# Patient Record
Sex: Female | Born: 1985 | Race: White | Hispanic: No | Marital: Married | Smoking: Never smoker
Health system: Southern US, Community
[De-identification: ages and names within clinical notes are randomized; demographics above are authoritative.]

## PROBLEM LIST (undated history)

## (undated) DIAGNOSIS — R51 Headache: Secondary | ICD-10-CM

## (undated) DIAGNOSIS — R519 Headache, unspecified: Secondary | ICD-10-CM

## (undated) DIAGNOSIS — T7840XA Allergy, unspecified, initial encounter: Secondary | ICD-10-CM

## (undated) DIAGNOSIS — K219 Gastro-esophageal reflux disease without esophagitis: Secondary | ICD-10-CM

## (undated) DIAGNOSIS — M25559 Pain in unspecified hip: Secondary | ICD-10-CM

## (undated) DIAGNOSIS — G43909 Migraine, unspecified, not intractable, without status migrainosus: Secondary | ICD-10-CM

## (undated) DIAGNOSIS — J302 Other seasonal allergic rhinitis: Secondary | ICD-10-CM

## (undated) DIAGNOSIS — F319 Bipolar disorder, unspecified: Secondary | ICD-10-CM

## (undated) DIAGNOSIS — M25569 Pain in unspecified knee: Secondary | ICD-10-CM

## (undated) DIAGNOSIS — N946 Dysmenorrhea, unspecified: Secondary | ICD-10-CM

## (undated) HISTORY — DX: Dysmenorrhea, unspecified: N94.6

## (undated) HISTORY — DX: Allergy, unspecified, initial encounter: T78.40XA

## (undated) HISTORY — DX: Gastro-esophageal reflux disease without esophagitis: K21.9

## (undated) HISTORY — DX: Headache, unspecified: R51.9

## (undated) HISTORY — DX: Bipolar disorder, unspecified: F31.9

## (undated) HISTORY — PX: TONSILLECTOMY: SUR1361

## (undated) HISTORY — DX: Other seasonal allergic rhinitis: J30.2

## (undated) HISTORY — DX: Pain in unspecified knee: M25.569

## (undated) HISTORY — PX: PLACEMENT OF BREAST IMPLANTS: SHX6334

## (undated) HISTORY — DX: Pain in unspecified hip: M25.559

## (undated) HISTORY — DX: Migraine, unspecified, not intractable, without status migrainosus: G43.909

## (undated) HISTORY — DX: Headache: R51

## (undated) HISTORY — PX: BREAST RECONSTRUCTION: SHX9

---
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
## 2010-11-19 DIAGNOSIS — F329 Major depressive disorder, single episode, unspecified: Secondary | ICD-10-CM | POA: Insufficient documentation

## 2010-11-19 DIAGNOSIS — G43909 Migraine, unspecified, not intractable, without status migrainosus: Secondary | ICD-10-CM | POA: Insufficient documentation

## 2010-11-19 DIAGNOSIS — F3181 Bipolar II disorder: Secondary | ICD-10-CM | POA: Insufficient documentation

## 2010-11-19 DIAGNOSIS — J302 Other seasonal allergic rhinitis: Secondary | ICD-10-CM | POA: Insufficient documentation

## 2010-11-19 DIAGNOSIS — F419 Anxiety disorder, unspecified: Secondary | ICD-10-CM | POA: Insufficient documentation

## 2013-12-25 DIAGNOSIS — N946 Dysmenorrhea, unspecified: Secondary | ICD-10-CM | POA: Insufficient documentation

## 2014-01-02 LAB — HM PAP SMEAR: HM Pap smear: NEGATIVE

## 2015-07-26 ENCOUNTER — Ambulatory Visit: Payer: BLUE CROSS/BLUE SHIELD | Admitting: Nurse Practitioner

## 2015-08-09 ENCOUNTER — Ambulatory Visit: Payer: BLUE CROSS/BLUE SHIELD | Admitting: Nurse Practitioner

## 2015-08-09 ENCOUNTER — Ambulatory Visit: Payer: BLUE CROSS/BLUE SHIELD | Admitting: Family Medicine

## 2015-08-22 ENCOUNTER — Encounter: Payer: Self-pay | Admitting: Obstetrics and Gynecology

## 2015-08-22 ENCOUNTER — Ambulatory Visit (INDEPENDENT_AMBULATORY_CARE_PROVIDER_SITE_OTHER): Payer: BLUE CROSS/BLUE SHIELD | Admitting: Obstetrics and Gynecology

## 2015-08-22 VITALS — BP 114/85 | HR 74 | Ht 69.0 in | Wt 218.8 lb

## 2015-08-22 DIAGNOSIS — B379 Candidiasis, unspecified: Secondary | ICD-10-CM | POA: Diagnosis not present

## 2015-08-22 DIAGNOSIS — F526 Dyspareunia not due to a substance or known physiological condition: Secondary | ICD-10-CM | POA: Diagnosis not present

## 2015-08-22 DIAGNOSIS — L439 Lichen planus, unspecified: Secondary | ICD-10-CM | POA: Diagnosis not present

## 2015-08-22 MED ORDER — DESOGESTREL-ETHINYL ESTRADIOL 0.15-0.02/0.01 MG (21/5) PO TABS: 1.0000 | ORAL_TABLET | Freq: Every day | ORAL | Status: DC

## 2015-08-22 MED ORDER — FLUCONAZOLE 150 MG PO TABS: 150.0000 mg | ORAL_TABLET | Freq: Once | ORAL | Status: DC

## 2015-08-22 MED ORDER — CLOBETASOL PROPIONATE 0.05 % EX OINT: 1.0000 "application " | TOPICAL_OINTMENT | Freq: Two times a day (BID) | CUTANEOUS | Status: DC

## 2015-08-22 MED ORDER — OXYCODONE-ACETAMINOPHEN 10-325 MG PO TABS: 1.0000 | ORAL_TABLET | ORAL | Status: DC | PRN

## 2015-08-22 NOTE — Patient Instructions (Signed)
Thank you for enrolling in MyChart. Please follow the instructions below to securely access your online medical record. MyChart allows you to send messages to your doctor, view your test results, renew your prescriptions, schedule appointments, and more.  How Do I Sign Up? 1. In your Internet browser, go to [email or web address] 2. Click on the New  User? link in the Sign In box.  3. Enter your MyChart Access Code exactly as it appears below. You will not need to use this code after you have completed the sign-up process. If you do not sign up before the expiration date, you must request a new code. MyChart Access Code: [email or web address] Expires: 09/03/2015 11:52 AM  4. Enter the last four digits of your Social Security Number (xxxx) and Date of Birth (mm/dd/yyyy) as indicated and click Next. You will be taken to the next sign-up page. 5. Create a MyChart ID. This will be your MyChart login ID and cannot be changed, so think of one that is secure and easy to remember. 6. Create a MyChart password. You can change your password at any time. 7. Enter your Password Reset Question and Answer and click Next. This can be used at a later time if you forget your password.  8. Select your communication preference, and if applicable enter your e-mail address. You will receive e-mail notification when new information is available in MyChart by choosing to receive e-mail notifications and filling in your e-mail. 9. Click Sign In. You can now view your medical record.   Additional Information If you have questions, you can email [email or web address] or call [phone] to talk to our MyChart staff. Remember, MyChart is NOT to be used for urgent needs. For medical emergencies, dial 911.   Lichen Sclerosus Lichen sclerosus is a skin problem. It can happen on any part of the body, but it commonly involves the anal or genital areas. It can cause itching and discomfort in these areas.  Treatment can help to control symptoms. When the genital area is affected, getting treatment is important because the condition can cause scarring that may lead to other problems. CAUSES The cause of this condition is not known. It could be the result of an overactive immune system or a lack of certain hormones. Lichen sclerosus is not an infection or a fungus. It is not passed from one person to another (not contagious). RISK FACTORS This condition is more likely to develop in women, usually after menopause. SYMPTOMS Symptoms of this condition include:  Thin, wrinkled, white areas on the skin.  Thickened white areas on the skin.  Red and swollen patches (lesions) on the skin.  Tears or cracks in the skin.  Bruising.  Blood blisters.  Severe itching. You may also have pain, itching, or burning with urination. Constipation is also common in people with lichen sclerosus. DIAGNOSIS This condition may be diagnosed with a physical exam. In some cases, a tissue sample (biopsy sample) may be removed to be looked at under a microscope. TREATMENT This condition is usually treated with medicated creams or ointments (topical steroids) that are applied over the affected areas. HOME CARE INSTRUCTIONS  Take over-the-counter and prescription medicines only as told by your health care provider.  Use creams or ointments as told by your health care provider.  Do not scratch the affected areas of skin.  Women should keep the vaginal area as clean and dry as possible.  Keep all follow-up visits as told by your health care  provider. This is important. SEEK MEDICAL CARE IF:  You have increasing redness, swelling, or pain in the affected area.  You have fluid, blood, or pus coming from the affected area.  You have new lesions on your skin.  You have pain or burning with urination.  You have pain during sex.   This information is not intended to replace advice given to you by your health  care provider. Make sure you discuss any questions you have with your health care provider.   Document Released: 09/10/2010 Document Revised: 01/09/2015 Document Reviewed: 07/16/2014 Elsevier Interactive Patient Education [employer]2016 Elsevier Inc.

## 2015-08-22 NOTE — Progress Notes (Signed)
Subjective:     Patient ID: Rachael Bennett, female   DOB: 1985-07-07, 30 y.o.   MRN: [id number]  HPI Progressively worsening pain at introitus with penetration, burning and tearing at times, vaginal itching all over. Menses are monthly but severe cramping and  Heavy on current OCP. Needs refill on percocet. [place] [facility] [place] [place] Sexually active a few times a month with fiance and they have been together for 3 years.   Review of Systems See above    Objective:   Physical Exam A&O x4  well groomed obese female in no distress Blood pressure 114/85, pulse 74, height 5\' 9"  (1.753 m), weight 218 lb 12.8 oz (99.247 kg), last menstrual period 08/10/2015. Pelvic exam: VULVA: normal appearing vulva with no masses, tenderness or lesions, vulvar hypopigmentation c/w lichen at 12 and 6 o clock, minimal labia minora tissue noted, with scaring at 6 o clock extending to rectum, tender on exam VAGINA: normal appearing vagina with normal color and discharge, no lesions, vaginal discharge - white, copious and creamy, RECTAL: normal rectal, no masses, no visible warts, WET MOUNT done - results: negative for pathogens, normal epithelial cells, lactobacilli.    Assessment:     Vaginal irritation dysparena Yeast infection Lichens planus   dysmenorrhea    Plan:     Diflucan 150mg  now, may repeat as needed. rx for clobetasol oint. To apply nightly to affected area x 6-8 weeks. Changed OCPs to Mircette- to start now.  RTC 6 weeks to recheck meds and perineum  Rachael Bennett, CNM

## 2015-09-03 ENCOUNTER — Ambulatory Visit (INDEPENDENT_AMBULATORY_CARE_PROVIDER_SITE_OTHER): Payer: BLUE CROSS/BLUE SHIELD | Admitting: Family Medicine

## 2015-09-03 ENCOUNTER — Encounter: Payer: Self-pay | Admitting: Family Medicine

## 2015-09-03 VITALS — BP 122/85 | HR 77 | Temp 98.9°F | Resp 16 | Ht 69.0 in | Wt 212.6 lb

## 2015-09-03 DIAGNOSIS — K591 Functional diarrhea: Secondary | ICD-10-CM | POA: Insufficient documentation

## 2015-09-03 DIAGNOSIS — E739 Lactose intolerance, unspecified: Secondary | ICD-10-CM | POA: Insufficient documentation

## 2015-09-03 DIAGNOSIS — M542 Cervicalgia: Secondary | ICD-10-CM

## 2015-09-03 DIAGNOSIS — Z7189 Other specified counseling: Secondary | ICD-10-CM | POA: Diagnosis not present

## 2015-09-03 DIAGNOSIS — J302 Other seasonal allergic rhinitis: Secondary | ICD-10-CM

## 2015-09-03 DIAGNOSIS — Z8249 Family history of ischemic heart disease and other diseases of the circulatory system: Secondary | ICD-10-CM | POA: Diagnosis not present

## 2015-09-03 DIAGNOSIS — Z833 Family history of diabetes mellitus: Secondary | ICD-10-CM

## 2015-09-03 DIAGNOSIS — G43709 Chronic migraine without aura, not intractable, without status migrainosus: Secondary | ICD-10-CM | POA: Diagnosis not present

## 2015-09-03 DIAGNOSIS — F3181 Bipolar II disorder: Secondary | ICD-10-CM

## 2015-09-03 DIAGNOSIS — Z7689 Persons encountering health services in other specified circumstances: Secondary | ICD-10-CM

## 2015-09-03 DIAGNOSIS — K219 Gastro-esophageal reflux disease without esophagitis: Secondary | ICD-10-CM | POA: Diagnosis not present

## 2015-09-03 DIAGNOSIS — R1084 Generalized abdominal pain: Secondary | ICD-10-CM | POA: Diagnosis not present

## 2015-09-03 MED ORDER — CULTURELLE DIGESTIVE HEALTH PO CAPS: 1.0000 | ORAL_CAPSULE | Freq: Every day | ORAL | Status: DC

## 2015-09-03 MED ORDER — FLUTICASONE PROPIONATE 50 MCG/ACT NA SUSP: 2.0000 | Freq: Every day | NASAL | Status: AC

## 2015-09-03 MED ORDER — CYCLOBENZAPRINE HCL 10 MG PO TABS: 10.0000 mg | ORAL_TABLET | Freq: Three times a day (TID) | ORAL | Status: DC | PRN

## 2015-09-03 MED ORDER — PROPRANOLOL HCL 40 MG PO TABS: 40.0000 mg | ORAL_TABLET | Freq: Two times a day (BID) | ORAL | Status: DC

## 2015-09-03 MED ORDER — LORATADINE 10 MG PO TABS: 10.0000 mg | ORAL_TABLET | Freq: Every day | ORAL | Status: DC

## 2015-09-03 MED ORDER — OMEPRAZOLE 20 MG PO CPDR: 20.0000 mg | DELAYED_RELEASE_CAPSULE | Freq: Every day | ORAL | Status: DC

## 2015-09-03 NOTE — Assessment & Plan Note (Signed)
Stomach pains may be dairy related- may also be IBS vs celiac disease. Continue to avoid dairy if it triggers symptoms.

## 2015-09-03 NOTE — Assessment & Plan Note (Signed)
IBS vs. Other GI disease. Check celiac panel. Consider allergy testing. Consider GI referral if symptoms do not improve.

## 2015-09-03 NOTE — Assessment & Plan Note (Signed)
Omeprazole 20mg  once daily. Alarm symptoms reviewed. Avoid diet triggers. Condier EGD if symptoms worsen.
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
## 2015-09-03 NOTE — Assessment & Plan Note (Signed)
Renewed flonase and claritin for seasonal allergies.
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
## 2015-09-03 NOTE — Assessment & Plan Note (Signed)
Continue inderal for migraine prophylaxis. Alarm symptoms reviewed.

## 2015-09-03 NOTE — Assessment & Plan Note (Signed)
Managed by psychiatry 

## 2015-09-03 NOTE — Patient Instructions (Signed)
Stomach pain: Try a probiotic- take a florastar or culturelle once daily. Continue immodium as needed. We will get some lab work to check on your stomach issues.  Look into the FODMAP diet.   Continue your omeprazole for GERD.

## 2015-09-05 LAB — CBC WITH DIFFERENTIAL/PLATELET
BASOS ABS: 0.1 10*3/uL (ref 0.0–0.2)
Basos: 0 %
EOS (ABSOLUTE): 0.1 10*3/uL (ref 0.0–0.4)
Eos: 1 %
HEMOGLOBIN: 11.5 g/dL (ref 11.1–15.9)
Hematocrit: 36.1 % (ref 34.0–46.6)
IMMATURE GRANS (ABS): 0 10*3/uL (ref 0.0–0.1)
IMMATURE GRANULOCYTES: 0 %
LYMPHS: 25 %
Lymphocytes Absolute: 2.9 10*3/uL (ref 0.7–3.1)
MCH: 26.1 pg — ABNORMAL LOW (ref 26.6–33.0)
MCHC: 31.9 g/dL (ref 31.5–35.7)
MCV: 82 fL (ref 79–97)
MONOCYTES: 8 %
Monocytes Absolute: 0.9 10*3/uL (ref 0.1–0.9)
NEUTROS PCT: 66 %
Neutrophils Absolute: 7.7 10*3/uL — ABNORMAL HIGH (ref 1.4–7.0)
PLATELETS: 509 10*3/uL — AB (ref 150–379)
RBC: 4.41 x10E6/uL (ref 3.77–5.28)
RDW: 15.5 % — ABNORMAL HIGH (ref 12.3–15.4)
WBC: 11.6 10*3/uL — AB (ref 3.4–10.8)

## 2015-09-05 LAB — COMPREHENSIVE METABOLIC PANEL
A/G RATIO: 1.5 (ref 1.2–2.2)
ALBUMIN: 4.3 g/dL (ref 3.5–5.5)
ALK PHOS: 82 IU/L (ref 39–117)
ALT: 13 IU/L (ref 0–32)
AST: 13 IU/L (ref 0–40)
BUN / CREAT RATIO: 10 (ref 9–23)
BUN: 9 mg/dL (ref 6–20)
CHLORIDE: 102 mmol/L (ref 96–106)
CO2: 22 mmol/L (ref 18–29)
Calcium: 9.4 mg/dL (ref 8.7–10.2)
Creatinine, Ser: 0.93 mg/dL (ref 0.57–1.00)
GFR calc non Af Amer: 82 mL/min/{1.73_m2} (ref >59)
GFR, EST AFRICAN AMERICAN: 95 mL/min/{1.73_m2} (ref >59)
GLOBULIN, TOTAL: 2.8 g/dL (ref 1.5–4.5)
GLUCOSE: 90 mg/dL (ref 65–99)
Potassium: 4.5 mmol/L (ref 3.5–5.2)
Sodium: 141 mmol/L (ref 134–144)
TOTAL PROTEIN: 7.1 g/dL (ref 6.0–8.5)

## 2015-09-05 LAB — CELIAC AB TTG DGP TIGA
Antigliadin Abs, IgA: 3 units (ref 0–19)
GLIADIN IGG: 2 U (ref 0–19)
IGA/IMMUNOGLOBULIN A, SERUM: 140 mg/dL (ref 87–352)
Transglutaminase IgA: <2 U/mL (ref 0–3)

## 2015-09-05 LAB — LIPID PANEL
CHOLESTEROL TOTAL: 200 mg/dL — AB (ref 100–199)
Chol/HDL Ratio: 3.7 ratio units (ref 0.0–4.4)
HDL: 54 mg/dL (ref >39)
LDL Calculated: 118 mg/dL — ABNORMAL HIGH (ref 0–99)
Triglycerides: 140 mg/dL (ref 0–149)
VLDL CHOLESTEROL CAL: 28 mg/dL (ref 5–40)

## 2015-09-05 LAB — HEMOGLOBIN A1C
Est. average glucose Bld gHb Est-mCnc: 123 mg/dL
Hgb A1c MFr Bld: 5.9 % — ABNORMAL HIGH (ref 4.8–5.6)

## 2015-10-09 ENCOUNTER — Ambulatory Visit (INDEPENDENT_AMBULATORY_CARE_PROVIDER_SITE_OTHER): Payer: BLUE CROSS/BLUE SHIELD | Admitting: Obstetrics and Gynecology

## 2015-10-09 ENCOUNTER — Encounter: Payer: Self-pay | Admitting: Obstetrics and Gynecology

## 2015-10-09 VITALS — BP 107/75 | HR 62 | Wt 212.4 lb

## 2015-10-09 DIAGNOSIS — B379 Candidiasis, unspecified: Secondary | ICD-10-CM | POA: Diagnosis not present

## 2015-10-09 DIAGNOSIS — N941 Unspecified dyspareunia: Secondary | ICD-10-CM

## 2015-10-09 DIAGNOSIS — R102 Pelvic and perineal pain: Secondary | ICD-10-CM | POA: Diagnosis not present

## 2015-10-09 DIAGNOSIS — L280 Lichen simplex chronicus: Secondary | ICD-10-CM | POA: Diagnosis not present

## 2015-10-09 NOTE — Progress Notes (Signed)
Subjective:     Patient ID: Rachael MunroeKatherine C Foltz, female   DOB: 09/23/1985, 30 y.o.   MRN: [id number]  HPI Here for follow-up exam for lichen of vulvar and dysmenorrhea/dysparenia. States all external 'tears' and pain are gone. States last menses, which was first one on Mircette, was less heavy but lasted a day or so longer, and cramping was there but not as bad. She reports less pain with sex and deep penetration- but still there. Also reports intermittent sharp pains over last week in lower pelvic are and in finishing an antibiotic for sinusitis.   Review of Systems See above    Objective:   Physical Exam A&O x4  well groomed female slightly anxious Filed Vitals:   10/09/15 1527  Weight: 212 lb 6 oz (96.333 kg)  abdomen soft and non-tender Pelvic exam: normal external genitalia, vulva, vagina, cervix, uterus and adnexa, WET MOUNT done - results: negative for pathogens, normal epithelial cells, lactobacilli.    Assessment:     [facility] Dysmenorrhea dysparenia improved, but not resolved  Pelvic pain of unknown etiology Yeast infection    Plan:     Told to hold onto clobex in case of flare ups of lichens Continue current OCP, will consider continuous dose if menses don't improve by end of third pack, or switching to depo  To take previously prescribed diflucan Counseled on possibility of endometriosis and treatment options.  RTC as needed.  Melody FontenelleShambley, CNM

## 2015-10-09 NOTE — Patient Instructions (Signed)
Endometriosis Endometriosis is a condition in which the tissue that lines the uterus (endometrium) grows outside of its normal location. The tissue may grow in many locations close to the uterus, but it commonly grows on the ovaries, fallopian tubes, vagina, or bowel. Because the uterus expels, or sheds, its lining every menstrual cycle, there is bleeding wherever the endometrial tissue is located. This can cause pain because blood is irritating to tissues not normally exposed to it.  CAUSES  The cause of endometriosis is not known.  SIGNS AND SYMPTOMS  Often, there are no symptoms. When symptoms are present, they can vary with the location of the displaced tissue. Various symptoms can occur at different times. Although symptoms occur mainly during a woman's menstrual period, they can also occur midcycle and usually stop with menopause. Some people may go months with no symptoms at all. Symptoms may include:   Back or abdominal pain.   Heavier bleeding during periods.   Pain during intercourse.   Painful bowel movements.   Infertility. DIAGNOSIS  Your health care provider will do a physical exam and ask about your symptoms. Various tests may be done, such as:   Blood tests and urine tests. These are done to help rule out other problems.   Ultrasound. This test is done to look for abnormal tissue.   An X-ray of the lower bowel (barium enema).  Laparoscopy. In this procedure, a thin, lighted tube with a tiny camera on the end (laparoscope) is inserted into your abdomen. This helps your health care provider look for abnormal tissue to confirm the diagnosis. The health care provider may also remove a small piece of tissue (biopsy) from any abnormal tissue found. This tissue sample can then be sent to a lab so it can be looked at under a microscope. TREATMENT  Treatment will vary and may include:   Medicines to relieve pain. Nonsteroidal anti-inflammatory drugs (NSAIDs) are a type of  pain medicine that can help to relieve the pain caused by endometriosis.  Hormonal therapy. When using hormonal therapy, periods are eliminated. This eliminates the monthly exposure to blood by the displaced endometrial tissue.   Surgery. Surgery may sometimes be done to remove the abnormal endometrial tissue. In severe cases, surgery may be done to remove the fallopian tubes, uterus, and ovaries (hysterectomy). HOME CARE INSTRUCTIONS   Take all medicines as directed by your health care provider. Do not take aspirin because it may increase bleeding when you are not on hormonal therapy.   Avoid activities that produce pain, including sexual activity. SEEK MEDICAL CARE IF:  You have pelvic pain before, after, or during your periods.  You have pelvic pain between periods that gets worse during your period.  You have pelvic pain during or after sex.  You have pelvic pain with bowel movements or urination, especially during your period.  You have problems getting pregnant.  You have a fever. SEEK IMMEDIATE MEDICAL CARE IF:   Your pain is severe and is not responding to pain medicine.   You have severe nausea and vomiting, or you cannot keep foods down.   You have pain that is limited to the right lower part of your abdomen.   You have swelling or increasing pain in your abdomen.   You see blood in your stool.  MAKE SURE YOU:   Understand these instructions.  Will watch your condition.  Will get help right away if you are not doing well or get worse.   This information   is not intended to replace advice given to you by your health care provider. Make sure you discuss any questions you have with your health care provider.   Document Released: 04/17/2000 Document Revised: 05/11/2014 Document Reviewed: 12/16/2012 Elsevier Interactive Patient Education 2016 [employer]  

## 2015-10-10 ENCOUNTER — Telehealth: Payer: Self-pay | Admitting: Obstetrics and Gynecology

## 2015-10-10 NOTE — Telephone Encounter (Signed)
Patient called stating she found out at her last visit she may have endometriosis. She just wanted to know what the next step would be as far as treatment. Please Advise

## 2015-10-21 ENCOUNTER — Encounter: Payer: Self-pay | Admitting: Family Medicine

## 2015-10-21 ENCOUNTER — Ambulatory Visit (INDEPENDENT_AMBULATORY_CARE_PROVIDER_SITE_OTHER): Payer: BLUE CROSS/BLUE SHIELD | Admitting: Family Medicine

## 2015-10-21 VITALS — BP 110/72 | HR 90 | Temp 98.8°F | Resp 16 | Ht 69.0 in | Wt 211.0 lb

## 2015-10-21 DIAGNOSIS — W57XXXA Bitten or stung by nonvenomous insect and other nonvenomous arthropods, initial encounter: Secondary | ICD-10-CM

## 2015-10-21 DIAGNOSIS — T148 Other injury of unspecified body region: Secondary | ICD-10-CM | POA: Diagnosis not present

## 2015-10-21 MED ORDER — TRIAMCINOLONE ACETONIDE 0.1 % EX CREA: 1.0000 "application " | TOPICAL_CREAM | Freq: Two times a day (BID) | CUTANEOUS | Status: DC

## 2015-10-21 NOTE — Progress Notes (Signed)
Subjective:    Patient ID: Rachael Bennett, female    DOB: [birth year], 30 y.o.   MRN: [id number]  HPI: Rachael Bennett is a 30 y.o. female presenting on 10/21/2015 for Insect Bite   HPI  Pt presents for tick bite. Was hiking- found tick on her L lower leg 2 weeks ago (approx 6/3). Removed with tissue and flushed him down toiler. Tick was small and brown. Present  Minutes to <1 hour. Had a small red area- pin prick sized that was healing well. Now mildy raised and itchy litke a mosquito bite after sitting on her porch last night. No fevers, no joint pain, no rashes. No HA. Was itching yesterday- a clear liquid and a little blood came out.   Past Medical History  Diagnosis Date  . Dysmenorrhea   . Bipolar disorder (HCC)   . Seasonal allergies   . Acid reflux   . Allergy   . Headache   . Migraine   . Hip pain   . Knee pain     Current Outpatient Prescriptions on File Prior to Visit  Medication Sig  . clobetasol ointment (TEMOVATE) 0.05 % Apply 1 application topically 2 (two) times daily. Apply to affected area  . cyclobenzaprine (FLEXERIL) 10 MG tablet Take 1 tablet (10 mg total) by mouth 3 (three) times daily as needed.  . desogestrel-ethinyl estradiol (MIRCETTE) 0.15-0.02/0.01 MG (21/5) tablet Take 1 tablet by mouth daily.  . fluticasone (FLONASE) 50 MCG/ACT nasal spray Place 2 sprays into both nostrils daily.  . Lactobacillus-Inulin (CULTURELLE DIGESTIVE HEALTH) CAPS Take 1 capsule by mouth daily.  Marland Kitchen loratadine (CLARITIN) 10 MG tablet Take 1 tablet (10 mg total) by mouth daily.  Marland Kitchen LORazepam (ATIVAN) 0.5 MG tablet Take 1 tablet by mouth as needed.   Marland Kitchen omeprazole (PRILOSEC) 20 MG capsule Take 1 capsule by mouth daily.  Marland Kitchen omeprazole (PRILOSEC) 20 MG capsule Take 1 capsule (20 mg total) by mouth daily.  . Oxcarbazepine (TRILEPTAL) 300 MG tablet Take 1 tablet by mouth 2 (two) times a week.  Marland Kitchen oxyCODONE-acetaminophen (PERCOCET) 10-325 MG tablet Take 1 tablet by mouth as needed.  .  propranolol (INDERAL) 40 MG tablet Take 1 tablet (40 mg total) by mouth 2 (two) times daily.  . Vitamins/Minerals TABS Take 1 tablet by mouth daily.  Marland Kitchen albuterol (PROVENTIL HFA;VENTOLIN HFA) 108 (90 Base) MCG/ACT inhaler Inhale into the lungs.   No current facility-administered medications on file prior to visit.    Review of Systems  Constitutional: Negative for fever, chills, activity change, appetite change and fatigue.  Musculoskeletal: Negative for arthralgias.  Skin: Positive for rash.       Itching   Hematological: Negative for adenopathy.   Per HPI unless specifically indicated above     Objective:    BP 110/72 mmHg  Pulse 90  Temp(Src) 98.8 F (37.1 C) (Oral)  Resp 16  Ht  (1.753 m)  Wt 211 lb (95.709 kg)  BMI 31.15 kg/m2  LMP 09/16/2015 (Approximate)  Wt Readings from Last 3 Encounters:  10/21/15 211 lb (95.709 kg)  10/09/15 212 lb 6 oz (96.333 kg)  09/03/15 212 lb 9.6 oz (96.435 kg)    Physical Exam  Cardiovascular: Normal rate and regular rhythm.   Lymphadenopathy:       Right: No inguinal adenopathy present.       Left: No inguinal adenopathy present.  Skin: Skin is warm and dry.  Single erythematous papule with ulcerated center. No erythema migrains. No  lymphadenopathy.   Results for orders placed or performed in visit on 09/03/15  HM PAP SMEAR  Result Value Ref Range   HM Pap smear negative   CBC with Differential  Result Value Ref Range   WBC 11.6 (H) 3.4 - 10.8 x10E3/uL   RBC 4.41 3.77 - 5.28 x10E6/uL   Hemoglobin 11.5 11.1 - 15.9 g/dL   Hematocrit 78.236.1 95.634.0 - 46.6 %   MCV 82 79 - 97 fL   MCH 26.1 (L) 26.6 - 33.0 pg   MCHC 31.9 31.5 - 35.7 g/dL   RDW 21.315.5 (H) 08.612.3 - 57.815.4 %   Platelets 509 (H) 150 - 379 x10E3/uL   Neutrophils 66 %   Lymphs 25 %   Monocytes 8 %   Eos 1 %   Basos 0 %   Neutrophils Absolute 7.7 (H) 1.4 - 7.0 x10E3/uL   Lymphocytes Absolute 2.9 0.7 - 3.1 x10E3/uL   Monocytes Absolute 0.9 0.1 - 0.9 x10E3/uL   EOS  (ABSOLUTE) 0.1 0.0 - 0.4 x10E3/uL   Basophils Absolute 0.1 0.0 - 0.2 x10E3/uL   Immature Granulocytes 0 %   Immature Grans (Abs) 0.0 0.0 - 0.1 x10E3/uL  Comprehensive Metabolic Panel (CMET)  Result Value Ref Range   Glucose 90 65 - 99 mg/dL   BUN 9 6 - 20 mg/dL   Creatinine, Ser 4.690.93 0.57 - 1.00 mg/dL   GFR calc non Af Amer 82 >59 mL/min/1.73   GFR calc Af Amer 95 >59 mL/min/1.73   BUN/Creatinine Ratio 10 9 - 23   Sodium 141 134 - 144 mmol/L   Potassium 4.5 3.5 - 5.2 mmol/L   Chloride 102 96 - 106 mmol/L   CO2 22 18 - 29 mmol/L   Calcium 9.4 8.7 - 10.2 mg/dL   Total Protein 7.1 6.0 - 8.5 g/dL   Albumin 4.3 3.5 - 5.5 g/dL   Globulin, Total 2.8 1.5 - 4.5 g/dL   Albumin/Globulin Ratio 1.5 1.2 - 2.2   Bilirubin Total <0.2 0.0 - 1.2 mg/dL   Alkaline Phosphatase 82 39 - 117 IU/L   AST 13 0 - 40 IU/L   ALT 13 0 - 32 IU/L  Lipid Profile  Result Value Ref Range   Cholesterol, Total 200 (H) 100 - 199 mg/dL   Triglycerides 629140 0 - 149 mg/dL   HDL 54 >52>39 mg/dL   VLDL Cholesterol Cal 28 5 - 40 mg/dL   LDL Calculated 841118 (H) 0 - 99 mg/dL   Chol/HDL Ratio 3.7 0.0 - 4.4 ratio units  Celiac Ab tTG DGP TIgA  Result Value Ref Range   IgA/Immunoglobulin A, Serum 140 87 - 352 mg/dL   Antigliadin Abs, IgA 3 0 - 19 units   Gliadin IgG 2 0 - 19 units   Transglutaminase IgA <2 0 - 3 U/mL   Tissue Transglut Ab <2 0 - 5 U/mL  Hemoglobin A1c  Result Value Ref Range   Hgb A1c MFr Bld 5.9 (H) 4.8 - 5.6 %   Est. average glucose Bld gHb Est-mCnc 123 mg/dL      Assessment & Plan:   Problem List Items Addressed This Visit    None    Visit Diagnoses    Insect bite    -  Primary    Likely an insect bit over previous tick bite. Low suspicion for tick bourne illnesses. Does not meet criteria for prophylaxis. Steroid cream BID. Alarm symptoms reviewed. Return if symptoms occur.     Relevant Medications  triamcinolone cream (KENALOG) 0.1 %       Meds ordered this encounter  Medications  .  triamcinolone cream (KENALOG) 0.1 %    Sig: Apply 1 application topically 2 (two) times daily.    Dispense:  30 g    Refill:  0    Order Specific Question:  Supervising Provider    Answer:  Janeann Forehand [[id number]]      Follow up plan: Return if symptoms worsen or fail to improve.

## 2015-10-21 NOTE — Patient Instructions (Signed)
I don't think we need to worry about any tick bourne illness. IF you develop fevers, joint pain, fatigue, or HA, or a bull's eye rash please let us know.  Apply steroid cream twice daily as needed for itching.

## 2015-10-23 NOTE — Telephone Encounter (Signed)
I discussed with her that she was already on the recommended treatment for endometriosis, and only needs to switch if it is not working.

## 2015-10-24 NOTE — Telephone Encounter (Signed)
No sonogram, as it typically doesn't show it unless she has a tumor. Also remind her I did not say she has endometriosis, but there is a possibility she could have it.

## 2015-10-24 NOTE — Telephone Encounter (Signed)
Wants to know if there is a special sonogram to check how bad her endometriosis

## 2015-10-25 ENCOUNTER — Encounter: Payer: Self-pay | Admitting: Emergency Medicine

## 2015-10-25 ENCOUNTER — Ambulatory Visit
Admission: EM | Admit: 2015-10-25 | Discharge: 2015-10-25 | Disposition: A | Discharge status: Home / Self Care | Payer: BLUE CROSS/BLUE SHIELD | Attending: Family Medicine | Admitting: Family Medicine

## 2015-10-25 DIAGNOSIS — S46811A Strain of other muscles, fascia and tendons at shoulder and upper arm level, right arm, initial encounter: Secondary | ICD-10-CM | POA: Diagnosis not present

## 2015-10-25 DIAGNOSIS — G43809 Other migraine, not intractable, without status migrainosus: Secondary | ICD-10-CM | POA: Diagnosis not present

## 2015-10-25 MED ORDER — KETOROLAC TROMETHAMINE 60 MG/2ML IM SOLN
60.0000 mg | Freq: Once | INTRAMUSCULAR | Status: AC
  Administered 2015-10-25: 60 mg via INTRAMUSCULAR

## 2015-10-25 MED ORDER — METHOCARBAMOL 750 MG PO TABS: 1500.0000 mg | ORAL_TABLET | Freq: Three times a day (TID) | ORAL | Status: DC | PRN

## 2015-10-25 MED ORDER — ONDANSETRON 4 MG PO TBDP
4.0000 mg | ORAL_TABLET | Freq: Once | ORAL | Status: AC
  Administered 2015-10-25: 4 mg via ORAL

## 2015-10-25 NOTE — ED Notes (Signed)
Pt with headache started yesterday, takes propanolol for migraines but not helping. States a pinched nerve in her neck was aggravated that caused the headache. Pt states lifts heavy boxes at work.
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
## 2015-10-25 NOTE — Discharge Instructions (Signed)
Migraine Headache A migraine headache is an intense, throbbing pain on one or both sides of your head. A migraine can last for 30 minutes to several hours. CAUSES  The exact cause of a migraine headache is not always known. However, a migraine may be caused when nerves in the brain become irritated and release chemicals that cause inflammation. This causes pain. Certain things may also trigger migraines, such as:  Alcohol.  Smoking.  Stress.  Menstruation.  Aged cheeses.  Foods or drinks that contain nitrates, glutamate, aspartame, or tyramine.  Lack of sleep.  Chocolate.  Caffeine.  Hunger.  Physical exertion.  Fatigue.  Medicines used to treat chest pain (nitroglycerine), birth control pills, estrogen, and some blood pressure medicines. SIGNS AND SYMPTOMS  Pain on one or both sides of your head.  Pulsating or throbbing pain.  Severe pain that prevents daily activities.  Pain that is aggravated by any physical activity.  Nausea, vomiting, or both.  Dizziness.  Pain with exposure to bright lights, loud noises, or activity.  General sensitivity to bright lights, loud noises, or smells. Before you get a migraine, you may get warning signs that a migraine is coming (aura). An aura may include:  Seeing flashing lights.  Seeing bright spots, halos, or zigzag lines.  Having tunnel vision or blurred vision.  Having feelings of numbness or tingling.  Having trouble talking.  Having muscle weakness. DIAGNOSIS  A migraine headache is often diagnosed based on:  Symptoms.  Physical exam.  A CT scan or MRI of your head. These imaging tests cannot diagnose migraines, but they can help rule out other causes of headaches. TREATMENT Medicines may be given for pain and nausea. Medicines can also be given to help prevent recurrent migraines.  HOME CARE INSTRUCTIONS  Only take over-the-counter or prescription medicines for pain or discomfort as directed by your  health care provider. The use of long-term narcotics is not recommended.  Lie down in a dark, quiet room when you have a migraine.  Keep a journal to find out what may trigger your migraine headaches. For example, write down:  What you eat and drink.  How much sleep you get.  Any change to your diet or medicines.  Limit alcohol consumption.  Quit smoking if you smoke.  Get 7-9 hours of sleep, or as recommended by your health care provider.  Limit stress.  Keep lights dim if bright lights bother you and make your migraines worse. SEEK IMMEDIATE MEDICAL CARE IF:   Your migraine becomes severe.  You have a fever.  You have a stiff neck.  You have vision loss.  You have muscular weakness or loss of muscle control.  You start losing your balance or have trouble walking.  You feel faint or pass out.  You have severe symptoms that are different from your first symptoms. MAKE SURE YOU:   Understand these instructions.  Will watch your condition.  Will get help right away if you are not doing well or get worse.   This information is not intended to replace advice given to you by your health care provider. Make sure you discuss any questions you have with your health care provider.   Document Released: 04/20/2005 Document Revised: 05/11/2014 Document Reviewed: 12/26/2012 Elsevier Interactive Patient Education 2016 [employer]  Muscle Strain A muscle strain (pulled muscle) happens when a muscle is stretched beyond normal length. It happens when a sudden, violent force stretches your muscle too far. Usually, a few of the fibers  in your muscle are torn. Muscle strain is common in athletes. Recovery usually takes 1-2 weeks. Complete healing takes 5-6 weeks.  HOME CARE   Follow the PRICE method of treatment to help your injury get better. Do this the first 2-3 days after the injury:  Protect. Protect the muscle to keep it from getting injured again.  Rest. Limit your  activity and rest the injured body part.  Ice. Put ice in a plastic bag. Place a towel between your skin and the bag. Then, apply the ice and leave it on from 15-20 minutes each hour. After the third day, switch to moist heat packs.  Compression. Use a splint or elastic bandage on the injured area for comfort. Do not put it on too tightly.  Elevate. Keep the injured body part above the level of your heart.  Only take medicine as told by your doctor.  Warm up before doing exercise to prevent future muscle strains. GET HELP IF:   You have more pain or puffiness (swelling) in the injured area.  You feel numbness, tingling, or notice a loss of strength in the injured area. MAKE SURE YOU:   Understand these instructions.  Will watch your condition.  Will get help right away if you are not doing well or get worse.   This information is not intended to replace advice given to you by your health care provider. Make sure you discuss any questions you have with your health care provider.   Document Released: 01/28/2008 Document Revised: 02/08/2013 Document Reviewed: 11/17/2012 Elsevier Interactive Patient Education [employer]

## 2015-11-01 ENCOUNTER — Ambulatory Visit
Admission: EM | Admit: 2015-11-01 | Discharge: 2015-11-01 | Disposition: A | Discharge status: Home / Self Care | Payer: BLUE CROSS/BLUE SHIELD | Attending: Family Medicine | Admitting: Family Medicine

## 2015-11-01 DIAGNOSIS — S46811D Strain of other muscles, fascia and tendons at shoulder and upper arm level, right arm, subsequent encounter: Secondary | ICD-10-CM

## 2015-11-01 MED ORDER — PREDNISONE 10 MG PO TABS: ORAL_TABLET | ORAL | Status: DC

## 2015-11-01 MED ORDER — KETOROLAC TROMETHAMINE 60 MG/2ML IM SOLN
60.0000 mg | Freq: Once | INTRAMUSCULAR | Status: AC
  Administered 2015-11-01: 60 mg via INTRAMUSCULAR

## 2015-11-01 MED ORDER — METHOCARBAMOL 750 MG PO TABS: 1500.0000 mg | ORAL_TABLET | Freq: Three times a day (TID) | ORAL | Status: DC | PRN

## 2015-11-01 NOTE — Discharge Instructions (Signed)
Take medication as prescribed. Rest. Apply ice. Stretch. Avoid strenuous activity.   Follow up with your primary care physician this week. Return to Urgent care for new or worsening concerns.
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
## 2015-11-01 NOTE — ED Notes (Signed)
Patient was seen about a week ago for right shoulder pain. She explains to me that the most pain is in the right side of the neck and radiates to the back. She is unable to lift her arm and it has progressively been getting worse.

## 2015-11-11 ENCOUNTER — Telehealth: Payer: Self-pay | Admitting: Obstetrics and Gynecology

## 2015-11-11 NOTE — Telephone Encounter (Signed)
Medication BC that she is taking is making her skin break out terribly she [facility] [place] [place]

## 2015-11-12 NOTE — Telephone Encounter (Signed)
Left message for pt that [facility] out of office until next week and will send message to her. To return on 11/19/15.

## 2015-11-18 ENCOUNTER — Other Ambulatory Visit: Payer: Self-pay | Admitting: Obstetrics and Gynecology

## 2015-11-20 ENCOUNTER — Other Ambulatory Visit: Payer: Self-pay | Admitting: Obstetrics and Gynecology

## 2015-11-20 MED ORDER — NORGESTIM-ETH ESTRAD TRIPHASIC 0.18/0.215/0.25 MG-35 MCG PO TABS: 1.0000 | ORAL_TABLET | Freq: Every day | ORAL | Status: DC

## 2015-11-20 NOTE — Telephone Encounter (Signed)
Please let her know I sent in new rx.

## 2015-12-03 ENCOUNTER — Ambulatory Visit (INDEPENDENT_AMBULATORY_CARE_PROVIDER_SITE_OTHER): Payer: BLUE CROSS/BLUE SHIELD | Admitting: Family Medicine

## 2015-12-03 ENCOUNTER — Encounter: Payer: Self-pay | Admitting: Family Medicine

## 2015-12-03 VITALS — BP 109/72 | HR 74 | Temp 98.7°F | Resp 16 | Ht 69.0 in | Wt 207.0 lb

## 2015-12-03 DIAGNOSIS — J069 Acute upper respiratory infection, unspecified: Secondary | ICD-10-CM | POA: Diagnosis not present

## 2015-12-03 DIAGNOSIS — J029 Acute pharyngitis, unspecified: Secondary | ICD-10-CM

## 2015-12-03 LAB — POCT RAPID STREP A (OFFICE): Rapid Strep A Screen: NEGATIVE

## 2015-12-03 MED ORDER — MAGIC MOUTHWASH: 5.0000 mL | Freq: Three times a day (TID) | ORAL | 0 refills | Status: DC | PRN

## 2015-12-03 MED ORDER — DM-GUAIFENESIN ER 30-600 MG PO TB12: 1.0000 | ORAL_TABLET | Freq: Two times a day (BID) | ORAL | 0 refills | Status: DC

## 2015-12-03 MED ORDER — PSEUDOEPHEDRINE HCL 60 MG PO TABS: 60.0000 mg | ORAL_TABLET | Freq: Three times a day (TID) | ORAL | 0 refills | Status: DC | PRN

## 2015-12-03 NOTE — Progress Notes (Signed)
Subjective:    Patient ID: Rachael Bennett, female    DOB: 1986/01/19, 30 y.o.   MRN: [id number]  HPI: Rachael Bennett is a 30 y.o. female presenting on 12/03/2015 for Sore Throat (fatigue, musclache, drainage, white spot in back of throat onset 3 days eyes burning )   HPI  Pt presents for sore throat, nasal congestion, cough, and ear pain. Symptoms started 3-4 days ago. Several people at work have had similar symptoms. Had chills at home- but no fever. Sore on R side of throat.    Past Medical History:  Diagnosis Date  . Acid reflux   . Allergy   . Bipolar disorder (HCC)   . Dysmenorrhea   . Headache   . Hip pain   . Knee pain   . Migraine   . Seasonal allergies     Current Outpatient Prescriptions on File Prior to Visit  Medication Sig  . clobetasol ointment (TEMOVATE) 0.05 % Apply 1 application topically 2 (two) times daily. Apply to affected area  . fluticasone (FLONASE) 50 MCG/ACT nasal spray Place 2 sprays into both nostrils daily.  . Lactobacillus-Inulin (CULTURELLE DIGESTIVE HEALTH) CAPS Take 1 capsule by mouth daily.  Marland Kitchen loratadine (CLARITIN) 10 MG tablet Take 1 tablet (10 mg total) by mouth daily.  Marland Kitchen LORazepam (ATIVAN) 0.5 MG tablet Take 1 tablet by mouth as needed.   . Norgestimate-Ethinyl Estradiol Triphasic (TRINESSA, 28,) 0.18/0.215/0.25 MG-35 MCG tablet Take 1 tablet by mouth daily.  Marland Kitchen omeprazole (PRILOSEC) 20 MG capsule Take 1 capsule (20 mg total) by mouth daily.  . Oxcarbazepine (TRILEPTAL) 300 MG tablet Take 1 tablet by mouth 2 (two) times a week.  Marland Kitchen oxyCODONE-acetaminophen (PERCOCET) 10-325 MG tablet Take 1 tablet by mouth as needed.  . propranolol (INDERAL) 40 MG tablet Take 1 tablet (40 mg total) by mouth 2 (two) times daily.  Marland Kitchen triamcinolone cream (KENALOG) 0.1 % Apply 1 application topically 2 (two) times daily.  . Vitamins/Minerals TABS Take 1 tablet by mouth daily.  Marland Kitchen albuterol (PROVENTIL HFA;VENTOLIN HFA) 108 (90 Base) MCG/ACT inhaler Inhale into  the lungs.   No current facility-administered medications on file prior to visit.     Review of Systems  Constitutional: Positive for chills. Negative for fever.  HENT: Positive for congestion, ear pain, mouth sores, postnasal drip, sinus pressure, sore throat and trouble swallowing. Negative for facial swelling.   Eyes: Negative for pain, itching and visual disturbance.  Respiratory: Positive for cough. Negative for chest tightness and wheezing.   Musculoskeletal: Negative for neck pain and neck stiffness.  Neurological: Positive for headaches. Negative for syncope and weakness.   Per HPI unless specifically indicated above     Objective:    BP 109/72 (BP Location: Right Arm, Patient Position: Sitting, Cuff Size: Normal)   Pulse 74   Temp 98.7 F (37.1 C) (Oral)   Resp 16   Ht 5\' 9"  (1.753 m)   Wt 207 lb (93.9 kg)   SpO2 99%   BMI 30.57 kg/m   Wt Readings from Last 3 Encounters:  12/03/15 207 lb (93.9 kg)  11/01/15 207 lb (93.9 kg)  10/21/15 211 lb (95.7 kg)    Physical Exam  Constitutional: She appears well-developed and well-nourished. No distress.  HENT:  Head: Normocephalic.  Right Ear: Tympanic membrane is not erythematous and not bulging.  Left Ear: Tympanic membrane is not erythematous and not bulging. A middle ear effusion is present.  Nose: Mucosal edema and rhinorrhea present. No sinus tenderness  or nasal septal hematoma. Right sinus exhibits maxillary sinus tenderness. Right sinus exhibits no frontal sinus tenderness. Left sinus exhibits maxillary sinus tenderness. Left sinus exhibits no frontal sinus tenderness.  Mouth/Throat: Uvula is midline and mucous membranes are normal. No uvula swelling. Posterior oropharyngeal erythema present. No oropharyngeal exudate, posterior oropharyngeal edema or tonsillar abscesses.    Neck: Neck supple. No Brudzinski's sign and no Kernig's sign noted.  Cardiovascular: Normal rate, regular rhythm and normal heart sounds.     Pulmonary/Chest: Breath sounds normal. No accessory muscle usage. No tachypnea. No respiratory distress.  Lymphadenopathy:    She has no cervical adenopathy.  Skin: She is not diaphoretic.   Results for orders placed or performed in visit on 12/03/15  POCT rapid strep A  Result Value Ref Range   Rapid Strep A Screen Negative Negative      Assessment & Plan:   Problem List Items Addressed This Visit    None    Visit Diagnoses    Sore throat    -  Primary   Rapid strep negative. Culture sent. R blister- apthus ulcer vs. herpangia. Magic mouthwash. Supportive care. Return if not improving.    Relevant Medications   magic mouthwash SOLN   Other Relevant Orders   POCT rapid strep A (Completed)   Culture, Group A Strep   Upper respiratory infection       Likely viral. Supportive care at home. Alarm symptoms reviewed. Return if not improving.    Relevant Medications   magic mouthwash SOLN   pseudoephedrine (SUDAFED) 60 MG tablet   dextromethorphan-guaiFENesin (MUCINEX DM) 30-600 MG 12hr tablet      Meds ordered this encounter  Medications  . Norgestimate-Ethinyl Estradiol Triphasic 0.18/0.215/0.25 MG-25 MCG tab    Sig: Take by mouth.  . Melatonin 5 MG TABS    Sig: Take 5 mg by mouth.  . cyclobenzaprine (FLEXERIL) 10 MG tablet    Sig: TK 1 TO 2 TS PO QHS    Refill:  3  . DISCONTD: LORazepam (ATIVAN) 1 MG tablet    Sig: TK 1/2 TO 1 T PO BID PRF ANXIETY    Refill:  2  . magic mouthwash SOLN    Sig: Take 5 mLs by mouth 3 (three) times daily as needed for mouth pain.    Dispense:  120 mL    Refill:  0    Order Specific Question:   Supervising Provider    Answer:   Janeann Forehand [[id number]]  . pseudoephedrine (SUDAFED) 60 MG tablet    Sig: Take 1 tablet (60 mg total) by mouth every 8 (eight) hours as needed.    Dispense:  30 tablet    Refill:  0    Order Specific Question:   Supervising Provider    Answer:   Janeann Forehand [phone]  . dextromethorphan-guaiFENesin  (MUCINEX DM) 30-600 MG 12hr tablet    Sig: Take 1 tablet by mouth 2 (two) times daily.    Dispense:  20 tablet    Refill:  0    Order Specific Question:   Supervising Provider    Answer:   Janeann Forehand [[id number]]      Follow up plan: Return if symptoms worsen or fail to improve.

## 2015-12-03 NOTE — Patient Instructions (Signed)
Your symptoms are consistent with a viral upper respiratory infection. At this time there is no need for antibiotics.  If your symptoms persist for > 10 days or get better and than worsen please let me know. You may have a secondary bacterial infection.  Please call me on Friday if symptoms are not improving.   You can use supportive care at home to help with your symptoms. I have sent Mucinex DM to your pharmacy to help break up the congestion and soothe your cough. You can takes this twice daily.  I have also sent tesslon perles to your pharmacy to help with the cough- you can take these 3 times daily as needed. Honey is a natural cough suppressant- so add it to your tea in the morning.  If you have a humidifer, set that up in your bedroom at night.

## 2015-12-05 LAB — CULTURE, GROUP A STREP: ORGANISM ID, BACTERIA: NORMAL

## 2015-12-05 NOTE — Progress Notes (Signed)
Patient notified. AM 

## 2015-12-10 ENCOUNTER — Telehealth: Payer: Self-pay | Admitting: Obstetrics and Gynecology

## 2015-12-10 ENCOUNTER — Other Ambulatory Visit: Payer: Self-pay | Admitting: *Deleted

## 2015-12-10 ENCOUNTER — Encounter: Payer: Self-pay | Admitting: Obstetrics and Gynecology

## 2015-12-10 MED ORDER — FLUCONAZOLE 150 MG PO TABS: 150.0000 mg | ORAL_TABLET | Freq: Once | ORAL | 1 refills | Status: AC

## 2015-12-10 NOTE — Telephone Encounter (Signed)
Done-ac 

## 2015-12-10 NOTE — Telephone Encounter (Signed)
Patient called requesting diflucan sent to the [facility] in [place]. She has a yeast infection.Thanks

## 2016-01-29 ENCOUNTER — Telehealth: Payer: Self-pay | Admitting: Obstetrics and Gynecology

## 2016-01-29 NOTE — Telephone Encounter (Signed)
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 She called her pharmacy and they told her they don't send RX request for that.

## 2016-01-30 ENCOUNTER — Other Ambulatory Visit: Payer: Self-pay | Admitting: Obstetrics and Gynecology

## 2016-01-30 MED ORDER — OXYCODONE-ACETAMINOPHEN 10-325 MG PO TABS: 1.0000 | ORAL_TABLET | ORAL | 0 refills | Status: DC | PRN

## 2016-01-30 NOTE — Telephone Encounter (Signed)
Please let her know it is done, but can't do refills, she will need to let us know when she needs more.

## 2016-02-24 ENCOUNTER — Telehealth: Payer: Self-pay

## 2016-02-24 DIAGNOSIS — G43709 Chronic migraine without aura, not intractable, without status migrainosus: Secondary | ICD-10-CM

## 2016-02-24 MED ORDER — PROPRANOLOL HCL 40 MG PO TABS: 40.0000 mg | ORAL_TABLET | Freq: Three times a day (TID) | ORAL | 3 refills | Status: DC

## 2016-02-24 NOTE — Telephone Encounter (Signed)
Patient called requesting a rx for Propranolol 40mg  and would like to increase to 3 times a day.  She reported that Amy Krebs, NP told her she could increase to tid.  She would like this sent to [facility] in [place].

## 2016-02-24 NOTE — Telephone Encounter (Signed)
Reviewed chart, taking Propanolol for migraine prophylaxis, has had significant improvement on 40 BID. Medication can safely be changed to TID as tolerated, sent in rx Propanolol 40mg  TID #90 pills, +3 refills.  Saralyn PilarAlexander Amanda Pote, DO [facility] 02/24/2016, 4:56 PM

## 2016-02-25 ENCOUNTER — Telehealth: Payer: Self-pay | Admitting: Obstetrics and Gynecology

## 2016-02-25 ENCOUNTER — Other Ambulatory Visit: Payer: Self-pay | Admitting: *Deleted

## 2016-02-25 MED ORDER — FLUCONAZOLE 150 MG PO TABS: 150.0000 mg | ORAL_TABLET | Freq: Once | ORAL | 1 refills | Status: AC

## 2016-02-25 NOTE — Telephone Encounter (Signed)
PT CALLED AND LEFT MESSAGE ON OFFICE VM STATING THAT SHE WOULD LIKE [facility] TO SEND IN A DIFLUCAN TO THE [facility] IN [place] FOR HER TODAY.

## 2016-02-25 NOTE — Telephone Encounter (Signed)
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

## 2016-02-26 ENCOUNTER — Other Ambulatory Visit: Payer: Self-pay | Admitting: *Deleted

## 2016-02-26 MED ORDER — FLUCONAZOLE 150 MG PO TABS: 150.0000 mg | ORAL_TABLET | Freq: Once | ORAL | 1 refills | Status: AC

## 2016-06-01 ENCOUNTER — Telehealth: Payer: Self-pay | Admitting: Family Medicine

## 2016-06-01 DIAGNOSIS — G43709 Chronic migraine without aura, not intractable, without status migrainosus: Secondary | ICD-10-CM

## 2016-06-01 MED ORDER — PROPRANOLOL HCL 40 MG PO TABS: 40.0000 mg | ORAL_TABLET | Freq: Three times a day (TID) | ORAL | 3 refills | Status: DC

## 2016-06-01 NOTE — Telephone Encounter (Signed)
Last refill Propanolol migraine prophylaxis 02/2016 with 40mg  tabs take TID, #90 pills for 30 day supply +3 refills. [facility] with +3 refills until establish with new PCP in 08/2016, then we will no longer refill this or any medication if she is no longer seen at our clinic.  Saralyn PilarAlexander Karamalegos, DO [facility] 06/01/2016, 12:37 PM

## 2016-06-01 NOTE — Telephone Encounter (Signed)
Pt called requesting refill for migraine but she will be changing provider in 08/2016 can we refill this Rx or she needs to call her new provider's office, you had refill this in 02/2016.

## 2016-06-01 NOTE — Addendum Note (Signed)
Addended by: Smitty CordsKARAMALEGOS, ALEXANDER J on: 06/01/2016 12:37 PM   Modules accepted: Orders

## 2016-07-07 ENCOUNTER — Ambulatory Visit
Admission: EM | Admit: 2016-07-07 | Discharge: 2016-07-07 | Disposition: A | Discharge status: Home / Self Care | Payer: BLUE CROSS/BLUE SHIELD | Attending: Family Medicine | Admitting: Family Medicine

## 2016-07-07 DIAGNOSIS — J0100 Acute maxillary sinusitis, unspecified: Secondary | ICD-10-CM

## 2016-07-07 DIAGNOSIS — B354 Tinea corporis: Secondary | ICD-10-CM | POA: Diagnosis not present

## 2016-07-07 DIAGNOSIS — L989 Disorder of the skin and subcutaneous tissue, unspecified: Secondary | ICD-10-CM | POA: Diagnosis not present

## 2016-07-07 MED ORDER — AMOXICILLIN-POT CLAVULANATE 875-125 MG PO TABS: 1.0000 | ORAL_TABLET | Freq: Two times a day (BID) | ORAL | 0 refills | Status: DC

## 2016-07-07 MED ORDER — KETOCONAZOLE 2 % EX CREA: 1.0000 "application " | TOPICAL_CREAM | Freq: Two times a day (BID) | CUTANEOUS | 0 refills | Status: DC

## 2016-07-07 MED ORDER — FLUCONAZOLE 150 MG PO TABS: 150.0000 mg | ORAL_TABLET | Freq: Once | ORAL | 0 refills | Status: AC

## 2016-07-07 NOTE — ED Triage Notes (Signed)
Pt c/o a patch of dry skin that came up about a month ago on the top of her right foot. It gets bright red sometimes, no pain no burning no itching. She also mentions left ear pain and dental pain.

## 2016-07-07 NOTE — ED Provider Notes (Signed)
[facility]    CSN: [id number] Arrival date & time: 07/07/16  1051     History   Chief Complaint No chief complaint on file.   HPI Rachael Bennett is a 31 y.o. female.   Patient is here because of multiple issues and concerns  #1 she has a rash on the dorsum of the left foot is been present for a couple weeks she's been using peroxide and sometimes gets anger when others. Apparently she call for dermatology appointment which will be in May which be the first a can see her. She is of course concerned about this lesion and questions how she got the lesion herself.  #2 She has a yellow-brown lesion on her left forearm where she states has been there for several weeks does not really like her foot she's worried about the not that she is on her forehead and rash.   #3She also reports having trouble with allergies and congestion she is sensitive to the pollen states that the pollen has had her nose and nasal passages swollen for about a month. Along with the swelling of the nasal passages she has a headache. She states this is how she develops into a sinusitis wants treatment for the sinusitis earlier than later since she's had a headache since this is going to be the next step. She states she is using her steroid nasal spray she uses her decongestant but she still has a sinus pressure. But she admits that is not a full-blown sinus infection. She is also informed me that 5 treat her with antibiotics which she wants she'll need medicine for yeast infection as well   Be noted the patient does not smoke she's had multiple surgeries several breast surgeries. She has an appointment with her primary care provider because she is trying to get impregnated no known drug allergies and she does not smoke. Past family history is not significant or contributory to today's visit she has history of allergies as reflux bipolar disorder dysmenorrhea and aches hip pain and knee pain. Family medical  history is positive for diabetes.      The history is provided by the patient. No language interpreter was used.  Rash  Location:  Foot and head/neck Head/neck rash location:  Head and scalp Quality: itchiness and redness   Severity:  Moderate Onset quality:  Sudden Timing:  Constant Chronicity:  New Context: animal contact and pollen   Relieved by:  Nothing Worsened by:  Nothing Ineffective treatments:  Antihistamines Associated symptoms: headaches   Associated symptoms: no sore throat     Past Medical History:  Diagnosis Date  . Acid reflux   . Allergy   . Bipolar disorder (HCC)   . Dysmenorrhea   . Headache   . Hip pain   . Knee pain   . Migraine   . Seasonal allergies     Patient Active Problem List   Diagnosis Date Noted  . Lactose intolerance 09/03/2015  . GERD (gastroesophageal reflux disease) 09/03/2015  . Neck pain 09/03/2015  . 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 Dysmenorrhea 12/25/2013  . Anxiety 11/19/2010  . Headache, migraine 11/19/2010  . Bipolar 2 disorder (HCC) 11/19/2010  . Allergic rhinitis, seasonal 11/19/2010    Past Surgical History:  Procedure Laterality Date  . BREAST RECONSTRUCTION     x 3  . PLACEMENT OF BREAST IMPLANTS    . TONSILLECTOMY      OB History    Gravida Para Term Preterm AB Living  0 0 0 0 0 0   SAB TAB Ectopic Multiple Live Births   0 0 0 0         Home Medications    Prior to Admission medications   Medication Sig Start Date End Date Taking? Authorizing Provider  albuterol (PROVENTIL HFA;VENTOLIN HFA) 108 (90 Base) MCG/ACT inhaler Inhale into the lungs. 10/10/14 07/07/16 Yes Historical Provider, MD  cyclobenzaprine (FLEXERIL) 10 MG tablet TK 1 TO 2 TS PO QHS 11/30/15  Yes Historical Provider, MD  fluticasone (FLONASE) 50 MCG/ACT nasal spray Place 2 sprays into both nostrils daily. 09/03/15  Yes Amy Rusty Aus, NP  Lactobacillus-Inulin (CULTURELLE DIGESTIVE HEALTH) CAPS Take 1 capsule by mouth daily. 09/03/15  Yes  Amy Rusty Aus, NP  LORazepam (ATIVAN) 0.5 MG tablet Take 1 tablet by mouth as needed.    Yes Historical Provider, MD  Melatonin 5 MG TABS Take 5 mg by mouth.   Yes Historical Provider, MD  Norgestimate-Ethinyl Estradiol Triphasic (TRINESSA, 28,) 0.18/0.215/0.25 MG-35 MCG tablet Take 1 tablet by mouth daily. 11/20/15  Yes Melody N Shambley, CNM  Norgestimate-Ethinyl Estradiol Triphasic 0.18/0.215/0.25 MG-25 MCG tab Take by mouth.   Yes Historical Provider, MD  omeprazole (PRILOSEC) 20 MG capsule Take 1 capsule (20 mg total) by mouth daily. 09/03/15  Yes Amy Rusty Aus, NP  Oxcarbazepine (TRILEPTAL) 300 MG tablet Take 1 tablet by mouth 2 (two) times a week.   Yes Historical Provider, MD  propranolol (INDERAL) 40 MG tablet Take 1 tablet (40 mg total) by mouth 3 (three) times daily. 06/01/16  Yes Smitty Cords, DO  Vitamins/Minerals TABS Take 1 tablet by mouth daily.   Yes Historical Provider, MD  amoxicillin-clavulanate (AUGMENTIN) 875-125 MG tablet Take 1 tablet by mouth 2 (two) times daily. 07/07/16   Hassan Rowan, MD  fluconazole (DIFLUCAN) 150 MG tablet Take 1 tablet (150 mg total) by mouth once. 07/07/16 07/07/16  Hassan Rowan, MD  ketoconazole (NIZORAL) 2 % cream Apply 1 application topically 2 (two) times daily. 07/07/16   Hassan Rowan, MD  loratadine (CLARITIN) 10 MG tablet Take 1 tablet (10 mg total) by mouth daily. 09/03/15   Amy Rusty Aus, NP    Family History Family History  Problem Relation Age of Onset  . Diabetes Father   . Hypertension Father   . Hyperlipidemia Father   . Heart disease Father   . Cancer Maternal Grandfather     lung    Social History Social History  Substance Use Topics  . Smoking status: Never Smoker  . Smokeless tobacco: Never Used  . Alcohol use Yes     Comment: occas     Allergies   Patient has no known allergies.   Review of Systems Review of Systems  Constitutional: Negative for activity change.  HENT: Positive for congestion, sinus pain  and sinus pressure. Negative for sore throat.   Musculoskeletal: Negative.   Skin: Positive for color change and rash.  Neurological: Positive for headaches.  Hematological: Positive for adenopathy.  All other systems reviewed and are negative.    Physical Exam Triage Vital Signs ED Triage Vitals  Enc Vitals Group     BP 07/07/16 1131 126/78     Pulse Rate 07/07/16 1131 64     Resp 07/07/16 1131 18     Temp 07/07/16 1131 98.2 F (36.8 C)     Temp Source 07/07/16 1131 Oral     SpO2 07/07/16 1131 100 %     Weight --  Height --      Head Circumference --      Peak Flow --      Pain Score 07/07/16 1130 0     Pain Loc --      Pain Edu? --      Excl. in GC? --    No data found.   Updated Vital Signs BP 126/78 (BP Location: Left Arm)   Pulse 64   Temp 98.2 F (36.8 C) (Oral)   Resp 18   LMP 07/01/2016   SpO2 100%   Visual Acuity Right Eye Distance:   Left Eye Distance:   Bilateral Distance:    Right Eye Near:   Left Eye Near:    Bilateral Near:     Physical Exam  Constitutional: She is oriented to person, place, and time. She appears well-developed and well-nourished.  HENT:  Head: Normocephalic and atraumatic.  Right Ear: Hearing, tympanic membrane, external ear and ear canal normal.  Left Ear: Hearing, tympanic membrane, external ear and ear canal normal.  Nose: Right sinus exhibits maxillary sinus tenderness. Right sinus exhibits no frontal sinus tenderness. Left sinus exhibits maxillary sinus tenderness. Left sinus exhibits no frontal sinus tenderness.  Mouth/Throat: Uvula is midline. No oral lesions. No dental abscesses or uvula swelling. No posterior oropharyngeal erythema.  Eyes: Pupils are equal, round, and reactive to light.  Neck: Normal range of motion. Neck supple.  Cardiovascular: Normal rate.   Pulmonary/Chest: Effort normal.  Musculoskeletal: Normal range of motion.  Lymphadenopathy:    She has cervical adenopathy.  Neurological: She is  alert and oriented to person, place, and time.  Skin: Rash noted. There is erythema.  Psychiatric: She has a normal mood and affect.  Vitals reviewed.    UC Treatments / Results  Labs (all labs ordered are listed, but only abnormal results are displayed) Labs Reviewed - No data to display  EKG  EKG Interpretation None       Radiology No results found.  Procedures Procedures (including critical care time)  Medications Ordered in UC Medications - No data to display   Initial Impression / Assessment and Plan / UC Course  I have reviewed the triage vital signs and the nursing notes.  Pertinent labs & imaging results that were available during my care of the patient were reviewed by me and considered in my medical decision making (see chart for details).   patient with multiple issues. For the possible sinus infection she's worried about not being treated before the sinus infection we [address] Augmentin 875 one tablet twice a day for 10 days also given prescription for Diflucan since she states she has to Diflucan which she is on antibiotics. This may help the adenopathy at that she has on the left forehead nausea was causing the rash think this may be a seborrheic keratosis type rash and she has a reactive lymph nodes but she has appointment with dermatologist will not follow dermatologist for the lesion on foot I think that is a ringworm will give her Nizoral cream to put on the left foot dorsum and she has 1 or 2 spots in the right foot she continues on that as well but if the Nizoral cream does not work I strongly suggest she keep her dermatology appointment and follow up with them. She declined work note for today.    Final Clinical Impressions(s) / UC Diagnoses   Final diagnoses:  Skin lesions  Tinea corporis  Subacute maxillary sinusitis  New Prescriptions Discharge Medication List as of 07/07/2016  2:05 PM    START taking these medications   Details    amoxicillin-clavulanate (AUGMENTIN) 875-125 MG tablet Take 1 tablet by mouth 2 (two) times daily., Starting Tue 07/07/2016, Normal    fluconazole (DIFLUCAN) 150 MG tablet Take 1 tablet (150 mg total) by mouth once., Starting Tue 07/07/2016, Normal    ketoconazole (NIZORAL) 2 % cream Apply 1 application topically 2 (two) times daily., Starting Tue 07/07/2016, Normal         Note: This dictation was prepared with Dragon dictation along with smaller phrase technology. Any transcriptional errors that result from this process are unintentional.   Hassan Rowan, MD 07/07/16 1426

## 2016-07-15 ENCOUNTER — Ambulatory Visit (INDEPENDENT_AMBULATORY_CARE_PROVIDER_SITE_OTHER): Payer: BLUE CROSS/BLUE SHIELD | Admitting: Certified Nurse Midwife

## 2016-07-15 ENCOUNTER — Encounter: Payer: Self-pay | Admitting: Certified Nurse Midwife

## 2016-07-15 VITALS — BP 124/84 | HR 60 | Temp 97.9°F | Wt 223.2 lb

## 2016-07-15 DIAGNOSIS — R3915 Urgency of urination: Secondary | ICD-10-CM

## 2016-07-15 DIAGNOSIS — R309 Painful micturition, unspecified: Secondary | ICD-10-CM

## 2016-07-15 LAB — POCT URINALYSIS DIPSTICK
BILIRUBIN UA: NEGATIVE
GLUCOSE UA: NEGATIVE
Ketones, UA: NEGATIVE
Leukocytes, UA: NEGATIVE
Nitrite, UA: NEGATIVE
Protein, UA: NEGATIVE
RBC UA: NEGATIVE
SPEC GRAV UA: 1.01
UROBILINOGEN UA: 0.2
pH, UA: 6.5

## 2016-07-15 NOTE — Progress Notes (Signed)
GYNECOLOGY ENCOUNTER NOTE  Subjective:   Rachael Bennett is a 31 y.o. G0P0000 female here for a possible UTI. Current complaints: urinary frequency,burning with urination and stabbing pain with urination. She also complains of a sinus infections that she is currently taking antibiotics for. Additionally she complains of nausea and vomiting, decrease in frequency of bowel movements.   Denies abnormal vaginal bleeding, discharge, pelvic pain, problems with intercourse or other gynecologic concerns.    Gynecologic History Patient's last menstrual period was 07/01/2016. Contraception: OCP (estrogen/progesterone) Last Pap: 01/02/2014. Results were: normal Last mammogram: n/a.  Obstetric History OB History  Gravida Para Term Preterm AB Living  0 0 0 0 0 0  SAB TAB Ectopic Multiple Live Births  0 0 0 0          Past Medical History:  Diagnosis Date  . Acid reflux   . Allergy   . Bipolar disorder (HCC)   . Dysmenorrhea   . Headache   . Hip pain   . Knee pain   . Migraine   . Seasonal allergies     Past Surgical History:  Procedure Laterality Date  . BREAST RECONSTRUCTION     x 3  . PLACEMENT OF BREAST IMPLANTS    . TONSILLECTOMY      Current Outpatient Prescriptions on File Prior to Visit  Medication Sig Dispense Refill  . albuterol (PROVENTIL HFA;VENTOLIN HFA) 108 (90 Base) MCG/ACT inhaler Inhale into the lungs.    Marland Kitchen amoxicillin-clavulanate (AUGMENTIN) 875-125 MG tablet Take 1 tablet by mouth 2 (two) times daily. 20 tablet 0  . cyclobenzaprine (FLEXERIL) 10 MG tablet TK 1 TO 2 TS PO QHS  3  . fluticasone (FLONASE) 50 MCG/ACT nasal spray Place 2 sprays into both nostrils daily. 16 g 11  . ketoconazole (NIZORAL) 2 % cream Apply 1 application topically 2 (two) times daily. 60 g 0  . Lactobacillus-Inulin (CULTURELLE DIGESTIVE HEALTH) CAPS Take 1 capsule by mouth daily.    Marland Kitchen loratadine (CLARITIN) 10 MG tablet Take 1 tablet (10 mg total) by mouth daily. 30 tablet 11  .  LORazepam (ATIVAN) 0.5 MG tablet Take 1 tablet by mouth as needed.     . Melatonin 5 MG TABS Take 5 mg by mouth.    . Norgestimate-Ethinyl Estradiol Triphasic (TRINESSA, 28,) 0.18/0.215/0.25 MG-35 MCG tablet Take 1 tablet by mouth daily. 28 tablet 10  . Norgestimate-Ethinyl Estradiol Triphasic 0.18/0.215/0.25 MG-25 MCG tab Take by mouth.    Marland Kitchen omeprazole (PRILOSEC) 20 MG capsule Take 1 capsule (20 mg total) by mouth daily. 30 capsule 11  . Oxcarbazepine (TRILEPTAL) 300 MG tablet Take 1 tablet by mouth 2 (two) times a week.    . propranolol (INDERAL) 40 MG tablet Take 1 tablet (40 mg total) by mouth 3 (three) times daily. 90 tablet 3  . Vitamins/Minerals TABS Take 1 tablet by mouth daily.     No current facility-administered medications on file prior to visit.     No Known Allergies  Social History   Social History  . Marital status: Married    Spouse name: N/A  . Number of children: N/A  . Years of education: N/A   Occupational History  . Not on file.   Social History Main Topics  . Smoking status: Never Smoker  . Smokeless tobacco: Never Used  . Alcohol use Yes     Comment: occas  . Drug use: No  . Sexual activity: Yes    Birth control/ protection: Pill  Other Topics Concern  . Not on file   Social History Narrative  . No narrative on file    Family History  Problem Relation Age of Onset  . Diabetes Father   . Hypertension Father   . Hyperlipidemia Father   . Heart disease Father   . Cancer Maternal Grandfather     lung    The following portions of the patient's history were reviewed and updated as appropriate: allergies, current medications, past family history, past medical history, past social history, past surgical history and problem list.  Review of Systems Pertinent items are noted in HPI.   Objective:  LMP 07/01/2016  CONSTITUTIONAL: Well-developed, well-nourished female in no acute distress.  SKIN: Skin is warm and dry. No rash noted. Not  diaphoretic. No erythema. No pallor. NEUROLOGIC: Alert and oriented to person, place, and time. Normal  muscle tone coordination.  PSYCHIATRIC: Normal mood and affect. Normal behavior. Normal judgment and thought content. Genitourinary: negative CVA tenderness ABDOMEN: Soft, normal bowel sounds, no distention noted.   PELVIC: Not indicated MUSCULOSKELETAL: Normal range of motion. Tenderness noted midline on pt. right lower side below ribs , above hip. No brushing or masses    Assessment:  Urine : negative, sent for culture    Plan:  Discussed possibility of current antibiotic therapy affecting urine dip in office today. Samples of Uristat given to pt. with instructions on use. Discussed possibility of the nausea/vomiting due to use of the antibiotic treatment. Pt states that she has had this reaction in the past with antibiotic medications. Suggested stool softer and diet high in fiber and water to improve bowel function. Reviewed stretching exercises for intermittent right sided pain. Routine preventative health maintenance measures emphasized. Encouraged appointment for AE and Pap smear. Will notify pt of results. Return for AE and or if symptoms/pain does not resolve.     Doreene BurkeAnnie Gavyn Zoss, CNM

## 2016-07-15 NOTE — Patient Instructions (Addendum)
Acute Urinary Retention, Female Urinary retention means you are unable to pee completely or at all (empty your bladder). Follow these instructions at home:  Drink enough fluids to keep your pee (urine) clear or pale yellow.  If you are sent home with a tube that drains the bladder (catheter), there will be a drainage bag attached to it. There are two types of bags. One is big that you can wear at night without having to empty it. One is smaller and needs to be emptied more often.  Keep the drainage bag emptied.  Keep the drainage bag lower than the tube.  Only take medicine as told by your doctor. Contact a doctor if:  You have a low-grade fever.  You have spasms or you are leaking pee when you have spasms. Get help right away if:  You have chills or a fever.  Your catheter stops draining pee.  Your catheter falls out.  You have increased bleeding that does not stop after you have rested and increased the amount of fluids you had been drinking. This information is not intended to replace advice given to you by your health care provider. Make sure you discuss any questions you have with your health care provider. Document Released: 10/07/2007 Document Revised: 09/26/2015 Document Reviewed: 09/29/2012 Elsevier Interactive Patient Education  2017 [employer] Constipation, Adult Constipation is when a person:  Poops (has a bowel movement) fewer times in a week than normal.  Has a hard time pooping.  Has poop that is dry, hard, or bigger than normal. Follow these instructions at home: Eating and drinking    Eat foods that have a lot of fiber, such as:  Fresh fruits and vegetables.  Whole grains.  Beans.  Eat less of foods that are high in fat, low in fiber, or overly processed, such as:  [place] fries.  Hamburgers.  Cookies.  Candy.  Soda.  Drink enough fluid to keep your pee (urine) clear or pale yellow. General instructions   Exercise regularly or as  told by your doctor.  Go to the restroom when you feel like you need to poop. Do not hold it in.  Take over-the-counter and prescription medicines only as told by your doctor. These include any fiber supplements.  Do pelvic floor retraining exercises, such as:  Doing deep breathing while relaxing your lower belly (abdomen).  Relaxing your pelvic floor while pooping.  Watch your condition for any changes.  Keep all follow-up visits as told by your doctor. This is important. Contact a doctor if:  You have pain that gets worse.  You have a fever.  You have not pooped for 4 days.  You throw up (vomit).  You are not hungry.  You lose weight.  You are bleeding from the anus.  You have thin, pencil-like poop (stool). Get help right away if:  You have a fever, and your symptoms suddenly get worse.  You leak poop or have blood in your poop.  Your belly feels hard or bigger than normal (is bloated).  You have very bad belly pain.  You feel dizzy or you faint. This information is not intended to replace advice given to you by your health care provider. Make sure you discuss any questions you have with your health care provider. Document Released: 10/07/2007 Document Revised: 11/08/2015 Document Reviewed: 10/09/2015 Elsevier Interactive Patient Education  2017 [employer]

## 2016-07-17 LAB — URINE CULTURE: Organism ID, Bacteria: NO GROWTH

## 2016-07-20 ENCOUNTER — Telehealth: Payer: Self-pay | Admitting: Certified Nurse Midwife

## 2016-07-20 NOTE — Telephone Encounter (Signed)
Left a message with Shaleah to Natalia Leatherwoodinform her that her urine culture was negative.   Doreene BurkeAnnie Broughton Eppinger, CNM

## 2016-08-20 ENCOUNTER — Other Ambulatory Visit: Payer: Self-pay | Admitting: Family Medicine

## 2016-08-20 DIAGNOSIS — K219 Gastro-esophageal reflux disease without esophagitis: Secondary | ICD-10-CM

## 2016-08-20 MED ORDER — OMEPRAZOLE 20 MG PO CPDR: 20.0000 mg | DELAYED_RELEASE_CAPSULE | Freq: Every day | ORAL | 3 refills | Status: DC

## 2016-08-21 ENCOUNTER — Other Ambulatory Visit: Payer: Self-pay

## 2016-08-21 MED ORDER — LORATADINE 10 MG PO TABS
10.0000 mg | ORAL_TABLET | Freq: Every day | ORAL | 0 refills | Status: AC
Start: 2016-08-21 — End: ?

## 2016-09-02 ENCOUNTER — Ambulatory Visit (INDEPENDENT_AMBULATORY_CARE_PROVIDER_SITE_OTHER): Payer: BLUE CROSS/BLUE SHIELD | Admitting: Obstetrics and Gynecology

## 2016-09-02 ENCOUNTER — Other Ambulatory Visit: Payer: Self-pay | Admitting: Obstetrics and Gynecology

## 2016-09-02 ENCOUNTER — Encounter: Payer: Self-pay | Admitting: Obstetrics and Gynecology

## 2016-09-02 VITALS — BP 109/75 | HR 69 | Wt 223.6 lb

## 2016-09-02 DIAGNOSIS — Z01419 Encounter for gynecological examination (general) (routine) without abnormal findings: Secondary | ICD-10-CM

## 2016-09-02 MED ORDER — NORGESTIM-ETH ESTRAD TRIPHASIC 0.18/0.215/0.25 MG-35 MCG PO TABS: 1.0000 | ORAL_TABLET | Freq: Every day | ORAL | 10 refills | Status: DC

## 2016-09-02 NOTE — Progress Notes (Signed)
Subjective:   Rachael Bennett is a 31 y.o. G0P0000 Caucasian female here for a routine well-woman exam.  Patient's last menstrual period was 08/22/2016 (exact date).    Current complaints: desires pregnancy in next 6-9 months. PCP: new       doesn't desire labs  Social History: Sexual: heterosexual Marital Status: married Living situation: with spouse Occupation: unknown occupation Tobacco/alcohol: no tobacco use Illicit drugs: no history of illicit drug use  The following portions of the patient's history were reviewed and updated as appropriate: allergies, current medications, past family history, past medical history, past social history, past surgical history and problem list.  Past Medical History Past Medical History:  Diagnosis Date  . Acid reflux   . Allergy   . Bipolar disorder (HCC)   . Dysmenorrhea   . Headache   . Hip pain   . Knee pain   . Migraine   . Seasonal allergies     Past Surgical History Past Surgical History:  Procedure Laterality Date  . BREAST RECONSTRUCTION     x 3  . PLACEMENT OF BREAST IMPLANTS    . TONSILLECTOMY      Gynecologic History G0P0000  Patient's last menstrual period was 08/22/2016 (exact date). Contraception: OCP (estrogen/progesterone) Last Pap: 2015. Results were: normal   Obstetric History OB History  Gravida Para Term Preterm AB Living  0 0 0 0 0 0  SAB TAB Ectopic Multiple Live Births  0 0 0 0          Current Medications Current Outpatient Prescriptions on File Prior to Visit  Medication Sig Dispense Refill  . albuterol (PROVENTIL HFA;VENTOLIN HFA) 108 (90 Base) MCG/ACT inhaler Inhale into the lungs.    . cyclobenzaprine (FLEXERIL) 10 MG tablet TK 1 TO 2 TS PO QHS  3  . fluticasone (FLONASE) 50 MCG/ACT nasal spray Place 2 sprays into both nostrils daily. 16 g 11  . loratadine (CLARITIN) 10 MG tablet Take 1 tablet (10 mg total) by mouth daily. 30 tablet 0  . LORazepam (ATIVAN) 0.5 MG tablet Take 1 tablet  by mouth as needed.     . Melatonin 5 MG TABS Take 5 mg by mouth.    . Norgestimate-Ethinyl Estradiol Triphasic (TRINESSA, 28,) 0.18/0.215/0.25 MG-35 MCG tablet Take 1 tablet by mouth daily. 28 tablet 10  . omeprazole (PRILOSEC) 20 MG capsule Take 1 capsule (20 mg total) by mouth daily. 30 capsule 3  . Oxcarbazepine (TRILEPTAL) 300 MG tablet Take 1 tablet by mouth 2 (two) times a week.    . propranolol (INDERAL) 40 MG tablet Take 1 tablet (40 mg total) by mouth 3 (three) times daily. 90 tablet 3  . Vitamins/Minerals TABS Take 1 tablet by mouth daily.     No current facility-administered medications on file prior to visit.     Review of Systems Patient denies any headaches, blurred vision, shortness of breath, chest pain, abdominal pain, problems with bowel movements, urination, or intercourse.  Objective:  BP 109/75   Pulse 69   Wt 223 lb 9.6 oz (101.4 kg)   LMP 08/22/2016 (Exact Date)   BMI 33.02 kg/m  Physical Exam  General:  Well developed, well nourished, no acute distress. She is alert and oriented x3. Skin:  Warm and dry Neck:  Midline trachea, no thyromegaly or nodules Cardiovascular: Regular rate and rhythm, no murmur heard Lungs:  Effort normal, all lung fields clear to auscultation bilaterally Breasts:  No dominant palpable mass, retraction, or nipple discharge Abdomen:  Soft, non tender, no hepatosplenomegaly or masses Pelvic:  External genitalia is normal in appearance.  The vagina is normal in appearance. The cervix is bulbous, no CMT.  Thin prep pap is done with HR HPV cotesting. Uterus is felt to be normal size, shape, and contour.  No adnexal masses or tenderness noted. Extremities:  No swelling or varicosities noted Psych:  She has a normal mood and affect  Assessment:   Healthy well-woman exam Overweight Bi-polar disorder Pre-conception counseling Migraines   Plan:  Discussed medications and use of each in pregnancy F/U 1 year for AE, or sooner if  needed   Melody Suzan Nailer, CNM

## 2016-09-02 NOTE — Patient Instructions (Signed)
Preparing for Pregnancy If you are considering becoming pregnant, make an appointment to see your regular health care provider to learn how to prepare for a safe and healthy pregnancy (preconception care). During a preconception care visit, your health care provider will:  Do a complete physical exam, including a Pap test.  Take a complete medical history.  Give you information, answer your questions, and help you resolve problems.  Preconception checklist Medical history  Tell your health care provider about any current or past medical conditions. Your pregnancy or your ability to become pregnant may be affected by chronic conditions, such as diabetes, chronic hypertension, and thyroid problems.  Include your family's medical history as well as your partner's medical history.  Tell your health care provider about any history of STIs (sexually transmitted infections).These can affect your pregnancy. In some cases, they can be passed to your baby. Discuss any concerns that you have about STIs.  If indicated, discuss the benefits of genetic testing. This testing will show whether there are any genetic conditions that may be passed from you or your partner to your baby.  Tell your health care provider about: ? Any problems you have had with conception or pregnancy. ? Any medicines you take. These include vitamins, herbal supplements, and over-the-counter medicines. ? Your history of immunizations. Discuss any vaccinations that you may need.  Diet  Ask your health care provider what to include in a healthy diet that has a balance of nutrients. This is especially important when you are pregnant or preparing to become pregnant.  Ask your health care provider to help you reach a healthy weight before pregnancy. ? If you are overweight, you may be at higher risk for certain complications, such as high blood pressure, diabetes, and preterm birth. ? If you are underweight, you are more likely  to have a baby who has a low birth weight.  Lifestyle, work, and home  Let your health care provider know: ? About any lifestyle habits that you have, such as alcohol use, drug use, or smoking. ? About recreational activities that may put you at risk during pregnancy, such as downhill skiing and certain exercise programs. ? Tell your health care provider about any international travel, especially any travel to places with an active Zika virus outbreak. ? About harmful substances that you may be exposed to at work or at home. These include chemicals, pesticides, radiation, or even litter boxes. ? If you do not feel safe at home.  Mental health  Tell your health care provider about: ? Any history of mental health conditions, including feelings of depression, sadness, or anxiety. ? Any medicines that you take for a mental health condition. These include herbs and supplements.  Home instructions to prepare for pregnancy Lifestyle  Eat a balanced diet. This includes fresh fruits and vegetables, whole grains, lean meats, low-fat dairy products, healthy fats, and foods that are high in fiber. Ask to meet with a nutritionist or registered dietitian for assistance with meal planning and goals.  Get regular exercise. Try to be active for at least 30 minutes a day on most days of the week. Ask your health care provider which activities are safe during pregnancy.  Do not use any products that contain nicotine or tobacco, such as cigarettes and e-cigarettes. If you need help quitting, ask your health care provider.  Do not drink alcohol.  Do not take illegal drugs.  Maintain a healthy weight. Ask your health care provider what weight range is   right for you.  General instructions  Keep an accurate record of your menstrual periods. This makes it easier for your health care provider to determine your baby's due date.  Begin taking prenatal vitamins and folic acid supplements daily as directed by  your health care provider.  Manage any chronic conditions, such as high blood pressure and diabetes, as told by your health care provider. This is important.  How do I know that I am pregnant? You may be pregnant if you have been sexually active and you miss your period. Symptoms of early pregnancy include:  Mild cramping.  Very light vaginal bleeding (spotting).  Feeling unusually tired.  Nausea and vomiting (morning sickness).  If you have any of these symptoms and you suspect that you might be pregnant, you can take a home pregnancy test. These tests check for a hormone in your urine (human chorionic gonadotropin, or hCG). A woman's body begins to make this hormone during early pregnancy. These tests are very accurate. Wait until at least the first day after you miss your period to take one. If the test shows that you are pregnant (you get a positive result), call your health care provider to make an appointment for prenatal care. What should I do if I become pregnant?  Make an appointment with your health care provider as soon as you suspect you are pregnant.  Do not use any products that contain nicotine, such as cigarettes, chewing tobacco, and e-cigarettes. If you need help quitting, ask your health care provider.  Do not drink alcoholic beverages. Alcohol is related to a number of birth defects.  Avoid toxic odors and chemicals.  You may continue to have sexual intercourse if it does not cause pain or other problems, such as vaginal bleeding. This information is not intended to replace advice given to you by your health care provider. Make sure you discuss any questions you have with your health care provider. Document Released: 04/02/2008 Document Revised: 12/17/2015 Document Reviewed: 11/10/2015 Elsevier Interactive Patient Education  2017 Elsevier Inc.  

## 2016-09-04 LAB — CYTOLOGY - PAP

## 2016-09-08 ENCOUNTER — Telehealth: Payer: Self-pay | Admitting: *Deleted

## 2016-09-08 NOTE — Telephone Encounter (Signed)
Notified pt of results 

## 2016-09-08 NOTE — Telephone Encounter (Signed)
-----   Message from Purcell NailsMelody N Shambley, [place] sent at 09/08/2016  9:32 AM EDT ----- Pap is negative, please remind her to sign of MyChart

## 2016-10-04 ENCOUNTER — Other Ambulatory Visit: Payer: Self-pay | Admitting: Family Medicine

## 2016-10-07 ENCOUNTER — Other Ambulatory Visit: Payer: Self-pay | Admitting: Family Medicine

## 2016-10-12 ENCOUNTER — Other Ambulatory Visit: Payer: Self-pay | Admitting: *Deleted

## 2016-10-12 ENCOUNTER — Telehealth: Payer: Self-pay | Admitting: Obstetrics and Gynecology

## 2016-10-12 MED ORDER — FLUCONAZOLE 150 MG PO TABS: 150.0000 mg | ORAL_TABLET | Freq: Once | ORAL | 1 refills | Status: AC

## 2016-10-12 NOTE — Telephone Encounter (Signed)
Done-ac 

## 2016-10-12 NOTE — Telephone Encounter (Signed)
Patient with itching, burning and discharge also with painful and frequent urination. Patient wants to try a diflucan first and she is taking over the counter AZO for the urinary issue and taking cranberry pills and drinking water. Could you call in the diflucan first - she has a busy week, if it doesn't help the urinary issue she will call back and schedule to come in. Pharmacy - [facility] [place]

## 2016-10-29 ENCOUNTER — Other Ambulatory Visit: Payer: Self-pay | Admitting: Obstetrics and Gynecology

## 2016-10-29 ENCOUNTER — Telehealth: Payer: Self-pay | Admitting: Obstetrics and Gynecology

## 2016-10-29 NOTE — Telephone Encounter (Signed)
pls advise

## 2016-10-29 NOTE — Telephone Encounter (Signed)
Patient LVM that she has stopped her BC pills and they are going to try to conceive in the next couple months - since stopping the pills her endometriosis pain has increased and she'd like to get a refill on the pain medication but only wants to fill for 10 pills.

## 2016-10-29 NOTE — Telephone Encounter (Signed)
Looks like it has been a while since she has been prescribed any pain medications. Please see which medication she is requesting.

## 2016-11-06 NOTE — Telephone Encounter (Signed)
Notified pt rx is ready 

## 2016-11-30 ENCOUNTER — Other Ambulatory Visit: Payer: Self-pay | Admitting: Family Medicine

## 2016-11-30 DIAGNOSIS — K219 Gastro-esophageal reflux disease without esophagitis: Secondary | ICD-10-CM

## 2017-01-14 ENCOUNTER — Other Ambulatory Visit: Payer: Self-pay | Admitting: Family Medicine

## 2017-01-14 DIAGNOSIS — K219 Gastro-esophageal reflux disease without esophagitis: Secondary | ICD-10-CM

## 2017-01-30 ENCOUNTER — Other Ambulatory Visit: Payer: Self-pay | Admitting: Family Medicine

## 2017-01-30 DIAGNOSIS — K219 Gastro-esophageal reflux disease without esophagitis: Secondary | ICD-10-CM

## 2017-02-03 ENCOUNTER — Ambulatory Visit (INDEPENDENT_AMBULATORY_CARE_PROVIDER_SITE_OTHER): Payer: BLUE CROSS/BLUE SHIELD | Admitting: Obstetrics and Gynecology

## 2017-02-03 ENCOUNTER — Encounter: Payer: Self-pay | Admitting: Obstetrics and Gynecology

## 2017-02-03 VITALS — BP 101/74 | HR 79 | Ht 69.0 in | Wt 213.7 lb

## 2017-02-03 DIAGNOSIS — Z113 Encounter for screening for infections with a predominantly sexual mode of transmission: Secondary | ICD-10-CM | POA: Diagnosis not present

## 2017-02-03 NOTE — Progress Notes (Signed)
Subjective:     Patient ID: Rachael Bennett, female   DOB: 07-09-1985, 31 y.o.   MRN: [id number]  HPI Desires STD testing in leu of trying for a pregnancy. Stopped BCP in July and menses have returned normally. Is weaning down on psych meds. Plan to try in December. Has hip issues and going to PT.denies vaginal itching or irritation. Currently sexually active with female spouse Denies any known exposure to STD. 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  Review of Systems negative    Objective:   Physical Exam A&ox4 Well groomed female in no distress Blood pressure 101/74, pulse 79, height  (1.753 m), weight 213 lb 11.2 oz (96.9 kg), last menstrual period 01/03/2017. PE not indicated     Assessment:     STD screening    Plan:     Labs obtained, and will follow up accordingly.  Tiger Spieker [place], CNM

## 2017-02-05 LAB — GC/CHLAMYDIA PROBE AMP
CHLAMYDIA, DNA PROBE: NEGATIVE
NEISSERIA GONORRHOEAE BY PCR: NEGATIVE

## 2017-02-08 LAB — RPR: RPR Ser Ql: NONREACTIVE

## 2017-02-08 LAB — RUBELLA SCREEN: Rubella Antibodies, IGG: 1.37 index (ref >0.99)

## 2017-02-08 LAB — HSV 1 AND 2 IGM ABS, INDIRECT: HSV 2 IgM: <1:10 {titer}

## 2017-02-08 LAB — HIV ANTIBODY (ROUTINE TESTING W REFLEX): HIV Screen 4th Generation wRfx: NONREACTIVE

## 2017-02-08 LAB — VARICELLA ZOSTER ANTIBODY, IGG: Varicella zoster IgG: 248 index (ref >165)

## 2017-04-06 ENCOUNTER — Other Ambulatory Visit: Payer: Self-pay | Admitting: *Deleted

## 2017-04-06 ENCOUNTER — Telehealth: Payer: Self-pay | Admitting: Obstetrics and Gynecology

## 2017-04-06 MED ORDER — FLUCONAZOLE 150 MG PO TABS
150.0000 mg | ORAL_TABLET | Freq: Once | ORAL | 1 refills | Status: AC
Start: 2017-04-06 — End: 2017-04-06

## 2017-04-06 NOTE — Telephone Encounter (Signed)
Done-ac 

## 2017-04-06 NOTE — Telephone Encounter (Signed)
[facility] uses [facility] [place]. Pt states itching and yeast infection symptoms. Please call in Diflucan. Since Thursday last week 04/01/17. Can call pt [phone].

## 2017-04-19 ENCOUNTER — Telehealth: Payer: Self-pay | Admitting: Obstetrics and Gynecology

## 2017-04-19 ENCOUNTER — Encounter: Payer: Self-pay | Admitting: Certified Nurse Midwife

## 2017-04-19 ENCOUNTER — Ambulatory Visit: Payer: BLUE CROSS/BLUE SHIELD | Admitting: Certified Nurse Midwife

## 2017-04-19 VITALS — BP 105/76 | HR 76 | Ht 69.0 in | Wt 203.6 lb

## 2017-04-19 DIAGNOSIS — R300 Dysuria: Secondary | ICD-10-CM | POA: Diagnosis not present

## 2017-04-19 DIAGNOSIS — N898 Other specified noninflammatory disorders of vagina: Secondary | ICD-10-CM | POA: Diagnosis not present

## 2017-04-19 LAB — POCT URINALYSIS DIPSTICK
Bilirubin, UA: NEGATIVE
Blood, UA: NEGATIVE
Glucose, UA: NEGATIVE
KETONES UA: NEGATIVE
LEUKOCYTES UA: NEGATIVE
NITRITE UA: NEGATIVE
PH UA: 7 (ref 5.0–8.0)
PROTEIN UA: NEGATIVE
Spec Grav, UA: 1.015 (ref 1.010–1.025)
UROBILINOGEN UA: 0.2 U/dL

## 2017-04-19 NOTE — Telephone Encounter (Signed)
Pt came in to see annie

## 2017-04-19 NOTE — Progress Notes (Signed)
GYN ENCOUNTER NOTE  Subjective:       Rachael Bennett is a 31 y.o. G0P0000 female is here for gynecologic evaluation of the following issues:  1. Pt complains of vaginal discharge that has oder, itching, burning since November. She states that she had 2 periods last month and since this she has been having symptoms. She has taken one diflucan  But continues to have symptoms. She is currently sexually active and has had a new partner.   Gynecologic History No LMP recorded. Last Pap: 09/02/16. Results were: normal Last mammogram: n/a.   Obstetric History OB History  Gravida Para Term Preterm AB Living  0 0 0 0 0 0  SAB TAB Ectopic Multiple Live Births  0 0 0 0          Past Medical History:  Diagnosis Date  . Acid reflux   . Allergy   . Bipolar disorder (HCC)   . Dysmenorrhea   . Headache   . Hip pain   . Knee pain   . Migraine   . Seasonal allergies     Past Surgical History:  Procedure Laterality Date  . BREAST RECONSTRUCTION     x 3  . PLACEMENT OF BREAST IMPLANTS    . TONSILLECTOMY      Current Outpatient Medications on File Prior to Visit  Medication Sig Dispense Refill  . albuterol (PROVENTIL HFA;VENTOLIN HFA) 108 (90 Base) MCG/ACT inhaler Inhale into the lungs.    . cyclobenzaprine (FLEXERIL) 10 MG tablet TK 1 TO 2 TS PO QHS  3  . fluticasone (FLONASE) 50 MCG/ACT nasal spray Place 2 sprays into both nostrils daily. 16 g 11  . loratadine (CLARITIN) 10 MG tablet Take 1 tablet (10 mg total) by mouth daily. 30 tablet 0  . LORazepam (ATIVAN) 0.5 MG tablet Take 1 tablet by mouth as needed.     . Melatonin 5 MG TABS Take 5 mg by mouth.    Marland Kitchen. omeprazole (PRILOSEC) 20 MG capsule Take by mouth.    Marland Kitchen. omeprazole (PRILOSEC) 20 MG capsule TAKE ONE CAPSULE BY MOUTH DAILY 30 capsule 0  . OXCARBAZEPINE PO Take 100 mg by mouth daily.     . propranolol (INDERAL) 40 MG tablet Take 1 tablet (40 mg total) by mouth 3 (three) times daily. (Patient taking differently: Take 80 mg by  mouth 2 (two) times daily. ) 90 tablet 3  . propranolol (INDERAL) 80 MG tablet   11  . SUMAtriptan (IMITREX) 25 MG tablet   11  . Vitamins/Minerals TABS Take 1 tablet by mouth daily.     No current facility-administered medications on file prior to visit.     No Known Allergies  Social History   Socioeconomic History  . Marital status: Married    Spouse name: Not on file  . Number of children: Not on file  . Years of education: Not on file  . Highest education level: Not on file  Social Needs  . Financial resource strain: Not on file  . Food insecurity - worry: Not on file  . Food insecurity - inability: Not on file  . Transportation needs - medical: Not on file  . Transportation needs - non-medical: Not on file  Occupational History  . Not on file  Tobacco Use  . Smoking status: Never Smoker  . Smokeless tobacco: Never Used  Substance and Sexual Activity  . Alcohol use: Yes    Comment: occas  . Drug use: No  . Sexual  activity: Yes  Other Topics Concern  . Not on file  Social History Narrative  . Not on file    Family History  Problem Relation Age of Onset  . Diabetes Father   . Hypertension Father   . Hyperlipidemia Father   . Heart disease Father   . Heart disease Mother        pacemaker (p-28) 04/04/2016  . Cancer Maternal Grandfather        lung    The following portions of the patient's history were reviewed and updated as appropriate: allergies, current medications, past family history, past medical history, past social history, past surgical history and problem list.  Review of Systems Review of Systems - Negative except as mentioned in HPI Review of Systems - General ROS: negative for - chills, fatigue, fever, hot flashes, malaise or night sweats Hematological and Lymphatic ROS: negative for - bleeding problems or swollen lymph nodes Gastrointestinal ROS: negative for - abdominal pain, blood in stools, change in bowel habits and  nausea/vomiting Musculoskeletal ROS: negative for - joint pain, muscle pain or muscular weakness Genito-Urinary ROS: negative for - change in menstrual cycle, dysmenorrhea, dyspareunia, dysuria, genital discharge, genital ulcers, hematuria, incontinence, irregular/heavy menses, nocturia or pelvic pain  Objective:   There were no vitals taken for this visit. CONSTITUTIONAL: Well-developed, well-nourished female in no acute distress.  HENT:  Normocephalic, atraumatic.  NECK: Normal range of motion, supple,  SKIN: Skin is warm and dry. No rash noted. Not diaphoretic. No erythema. No pallor. NEUROLGIC: Alert and oriented to person, place, and time.  PSYCHIATRIC: Normal mood and affect. Normal behavior. Normal judgment and thought content. CARDIOVASCULAR:Not Examined RESPIRATORY: Not Examined BREASTS: Not Examined ABDOMEN: Soft, non distended; Non tender.  No Organomegaly. PELVIC:  External Genitalia: Normal  BUS: Normal  Vagina: white discharge, no odor , erythema   Cervix: ectropion present, erythema   Uterus: Normal size, shape,consistency, mobile  Adnexa: Normal  RV: Normal   Bladder: Nontender MUSCULOSKELETAL: Normal range of motion. No tenderness.  No cyanosis, clubbing, or edema.  Assessment:   Vaginal Discharge    Plan:  Encouraged use of boric acid suppository to maintain normal ph balance of vagina. Reviewed instructions for use. Will follow up with results of nuswab.   Doreene BurkeAnnie Clyde Zarrella, CNM

## 2017-04-19 NOTE — Telephone Encounter (Signed)
The patient called and stated that she may still have BV and the patient would like to be seen or have a script filled to help with symptoms. Please advise.

## 2017-04-19 NOTE — Patient Instructions (Signed)

## 2017-04-22 LAB — NUSWAB VAGINITIS PLUS (VG+)
CANDIDA ALBICANS, NAA: NEGATIVE
CANDIDA GLABRATA, NAA: NEGATIVE
Chlamydia trachomatis, NAA: NEGATIVE
MEGASPHAERA 1: HIGH {score} — AB
Neisseria gonorrhoeae, NAA: NEGATIVE
TRICH VAG BY NAA: NEGATIVE

## 2017-04-23 ENCOUNTER — Other Ambulatory Visit: Payer: Self-pay | Admitting: Certified Nurse Midwife

## 2017-04-23 ENCOUNTER — Encounter: Payer: Self-pay | Admitting: Certified Nurse Midwife

## 2017-04-23 MED ORDER — METRONIDAZOLE 500 MG PO TABS: 500.0000 mg | ORAL_TABLET | Freq: Two times a day (BID) | ORAL | 0 refills | Status: AC

## 2017-04-23 MED ORDER — FLUCONAZOLE 150 MG PO TABS: 150.0000 mg | ORAL_TABLET | Freq: Once | ORAL | 0 refills | Status: AC

## 2017-04-23 NOTE — Progress Notes (Signed)
Nuswab positive for bacterial vaginosis.   Order placed to pharmacy on file.   Doreene BurkeAnnie Oralia Criger, CNM

## 2017-05-13 ENCOUNTER — Telehealth: Payer: Self-pay | Admitting: Obstetrics and Gynecology

## 2017-05-13 NOTE — Telephone Encounter (Signed)
appt made for 05/17/17

## 2017-05-13 NOTE — Telephone Encounter (Signed)
The patient called and stated that she needs another refill of diflucan, The first prescription did not work. The patient would also like to have a call back from her nurse if there are any other questions or concerns. Please advise.

## 2017-05-17 ENCOUNTER — Ambulatory Visit: Payer: BLUE CROSS/BLUE SHIELD | Admitting: Certified Nurse Midwife

## 2017-05-17 ENCOUNTER — Encounter: Payer: Self-pay | Admitting: Certified Nurse Midwife

## 2017-05-17 VITALS — BP 110/75 | HR 65 | Ht 69.0 in | Wt 205.0 lb

## 2017-05-17 DIAGNOSIS — N898 Other specified noninflammatory disorders of vagina: Secondary | ICD-10-CM | POA: Diagnosis not present

## 2017-05-17 MED ORDER — FLUCONAZOLE 150 MG PO TABS: 150.0000 mg | ORAL_TABLET | Freq: Once | ORAL | 1 refills | Status: AC

## 2017-05-17 NOTE — Progress Notes (Signed)
GYN ENCOUNTER NOTE  Subjective:       Rachael Bennett is a 32 y.o. G0P0000 female is here for gynecologic evaluation of the following issues:  1. Discharge that is milky and sometimes chunky for the last 10-12 days. She also notes a mild sour odor, dryness, and itching. She states that she has been on an antibiotic for the past 20 days for a chronic sinus infection.    Gynecologic History Patient's last menstrual period was 04/16/2017. Contraception: condoms Last Pap: 5/2/2018Results were: normal Last mammogram: n/a.  Obstetric History OB History  Gravida Para Term Preterm AB Living  0 0 0 0 0 0  SAB TAB Ectopic Multiple Live Births  0 0 0 0          Past Medical History:  Diagnosis Date  . Acid reflux   . Allergy   . Bipolar disorder (HCC)   . Dysmenorrhea   . Headache   . Hip pain   . Knee pain   . Migraine   . Seasonal allergies     Past Surgical History:  Procedure Laterality Date  . BREAST RECONSTRUCTION     x 3  . PLACEMENT OF BREAST IMPLANTS    . TONSILLECTOMY      Current Outpatient Medications on File Prior to Visit  Medication Sig Dispense Refill  . cyclobenzaprine (FLEXERIL) 10 MG tablet TK 1 TO 2 TS PO QHS  3  . fluticasone (FLONASE) 50 MCG/ACT nasal spray Place 2 sprays into both nostrils daily. 16 g 11  . loratadine (CLARITIN) 10 MG tablet Take 1 tablet (10 mg total) by mouth daily. 30 tablet 0  . LORazepam (ATIVAN) 0.5 MG tablet Take 1 tablet by mouth as needed.     . Melatonin 5 MG TABS Take 5 mg by mouth.    Marland Kitchen. omeprazole (PRILOSEC) 20 MG capsule TAKE ONE CAPSULE BY MOUTH DAILY 30 capsule 0  . OXCARBAZEPINE PO Take 100 mg by mouth daily.     . propranolol (INDERAL) 40 MG tablet Take 1 tablet (40 mg total) by mouth 3 (three) times daily. 90 tablet 3  . propranolol (INDERAL) 80 MG tablet   11  . SUMAtriptan (IMITREX) 25 MG tablet   11  . Vitamins/Minerals TABS Take 1 tablet by mouth daily.    Marland Kitchen. albuterol (PROVENTIL HFA;VENTOLIN HFA) 108 (90  Base) MCG/ACT inhaler Inhale into the lungs.     No current facility-administered medications on file prior to visit.     No Known Allergies  Social History   Socioeconomic History  . Marital status: Married    Spouse name: Not on file  . Number of children: Not on file  . Years of education: Not on file  . Highest education level: Not on file  Social Needs  . Financial resource strain: Not on file  . Food insecurity - worry: Not on file  . Food insecurity - inability: Not on file  . Transportation needs - medical: Not on file  . Transportation needs - non-medical: Not on file  Occupational History  . Not on file  Tobacco Use  . Smoking status: Never Smoker  . Smokeless tobacco: Never Used  Substance and Sexual Activity  . Alcohol use: Yes    Comment: occas  . Drug use: No  . Sexual activity: Yes  Other Topics Concern  . Not on file  Social History Narrative  . Not on file    Family History  Problem Relation Age of Onset  .  Diabetes Father   . Hypertension Father   . Hyperlipidemia Father   . Heart disease Father   . Heart disease Mother        pacemaker (p-28) 04/04/2016  . Cancer Maternal Grandfather        lung    The following portions of the patient's history were reviewed and updated as appropriate: allergies, current medications, past family history, past medical history, past social history, past surgical history and problem list.  Review of Systems Review of Systems - Negative except as mentioned in HPI Review of Systems - General ROS: negative for - chills, fatigue, fever, hot flashes, malaise or night sweats Hematological and Lymphatic ROS: negative for - bleeding problems or swollen lymph nodes Gastrointestinal ROS: negative for - abdominal pain, blood in stools, change in bowel habits and nausea/vomiting Musculoskeletal ROS: negative for - joint pain, muscle pain or muscular weakness Genito-Urinary ROS: negative for - change in menstrual cycle,  dysmenorrhea, dyspareunia, dysuria, genital discharge, genital ulcers, hematuria, incontinence, irregular/heavy menses, nocturia or pelvic painjj  Objective:   BP 110/75 (BP Location: Left Arm, Patient Position: Sitting, Cuff Size: Normal)   Pulse 65   Ht 5\' 9"  (1.753 m)   Wt 205 lb (93 kg)   LMP 04/16/2017   BMI 30.27 kg/m  CONSTITUTIONAL: Well-developed, well-nourished female in no acute distress.  HENT:  Normocephalic, atraumatic.  NECK: Normal range of motion, supple SKIN: Skin is warm and dry. No rash noted. Not diaphoretic. No erythema. No pallor. NEUROLGIC: Alert and oriented to person, place, and time.  PSYCHIATRIC: Normal mood and affect. Normal behavior. Normal judgment and thought content. CARDIOVASCULAR:Not Examined RESPIRATORY: Not Examined BREASTS: Not Examined ABDOMEN: Soft, non distended; Non tender.  No Organomegaly. PELVIC:  External Genitalia: Normal  BUS: Normal  Vagina: Normal, white thick discharge noted, no odor on exam   Cervix: Normal  Uterus: Normal size, shape,consistency, mobile  Adnexa: Normal  RV: Normal   Bladder: Nontender MUSCULOSKELETAL: Normal range of motion. No tenderness.  No cyanosis, clubbing, or edema.  Assessment:   Vaginal discharge    Plan:   Nuswab sent t R/O BV . Prescription sent to pharmacy for diflucan. Recommended use of boric for preventative maintenances.

## 2017-05-17 NOTE — Patient Instructions (Signed)

## 2017-05-19 ENCOUNTER — Encounter: Payer: Self-pay | Admitting: Certified Nurse Midwife

## 2017-05-19 LAB — NUSWAB BV AND CANDIDA, NAA
Candida albicans, NAA: NEGATIVE
Candida glabrata, NAA: NEGATIVE

## 2017-07-19 ENCOUNTER — Other Ambulatory Visit: Payer: Self-pay

## 2017-07-19 ENCOUNTER — Telehealth: Payer: Self-pay | Admitting: Certified Nurse Midwife

## 2017-07-19 NOTE — Telephone Encounter (Signed)
Patient called stating she has a yeast infection. She would like something called in for her. Please Advise. Thanks

## 2017-07-20 ENCOUNTER — Other Ambulatory Visit: Payer: Self-pay

## 2017-07-20 ENCOUNTER — Telehealth: Payer: Self-pay | Admitting: Obstetrics and Gynecology

## 2017-07-20 MED ORDER — FLUCONAZOLE 150 MG PO TABS: 150.0000 mg | ORAL_TABLET | Freq: Once | ORAL | 3 refills | Status: AC

## 2017-07-20 MED ORDER — FLUCONAZOLE 150 MG PO TABS: 150.0000 mg | ORAL_TABLET | Freq: Once | ORAL | 1 refills | Status: DC

## 2017-07-20 NOTE — Telephone Encounter (Signed)
The patient called and stated that she needs a prescription of Diflucan called into her pharmacy [facility] [place] [address]. The patient is very uncomfortable. Please advise.

## 2017-07-20 NOTE — Telephone Encounter (Signed)
Done-ac 

## 2017-10-04 ENCOUNTER — Other Ambulatory Visit: Payer: Self-pay

## 2017-10-04 ENCOUNTER — Telehealth: Payer: Self-pay | Admitting: Certified Nurse Midwife

## 2017-10-04 MED ORDER — FLUCONAZOLE 150 MG PO TABS: 150.0000 mg | ORAL_TABLET | Freq: Once | ORAL | 3 refills | Status: AC

## 2017-10-04 NOTE — Telephone Encounter (Signed)
The patient called and stated she is still having recurrent yeast infections with same symptoms; can she get a refill on Diflucan as it always works well for her.  Patient is using [facility] [place]  Please advise  Thank you

## 2017-10-08 ENCOUNTER — Other Ambulatory Visit: Payer: Self-pay

## 2017-10-08 ENCOUNTER — Ambulatory Visit (INDEPENDENT_AMBULATORY_CARE_PROVIDER_SITE_OTHER): Payer: BLUE CROSS/BLUE SHIELD | Admitting: Certified Nurse Midwife

## 2017-10-08 DIAGNOSIS — R300 Dysuria: Secondary | ICD-10-CM

## 2017-10-08 DIAGNOSIS — R350 Frequency of micturition: Secondary | ICD-10-CM

## 2017-10-08 NOTE — Progress Notes (Signed)
Urine drop for culture due to UTI symptoms  Doreene BurkeAnnie Ric Rosen[place], CNM

## 2017-10-10 ENCOUNTER — Encounter (INDEPENDENT_AMBULATORY_CARE_PROVIDER_SITE_OTHER): Payer: Self-pay

## 2017-10-10 LAB — URINE CULTURE: ORGANISM ID, BACTERIA: NO GROWTH

## 2017-11-03 ENCOUNTER — Other Ambulatory Visit
Admission: RE | Admit: 2017-11-03 | Discharge: 2017-11-03 | Disposition: A | Discharge status: Home / Self Care | Payer: BLUE CROSS/BLUE SHIELD | Source: Ambulatory Visit | Attending: Gastroenterology | Admitting: Gastroenterology

## 2017-11-03 DIAGNOSIS — R1032 Left lower quadrant pain: Secondary | ICD-10-CM | POA: Insufficient documentation

## 2017-11-03 LAB — GASTROINTESTINAL PANEL BY PCR, STOOL (REPLACES STOOL CULTURE)
ASTROVIRUS: NOT DETECTED
Adenovirus F40/41: NOT DETECTED
CAMPYLOBACTER SPECIES: NOT DETECTED
CRYPTOSPORIDIUM: NOT DETECTED
CYCLOSPORA CAYETANENSIS: NOT DETECTED
ENTAMOEBA HISTOLYTICA: NOT DETECTED
ENTEROTOXIGENIC E COLI (ETEC): NOT DETECTED
Enteroaggregative E coli (EAEC): NOT DETECTED
Enteropathogenic E coli (EPEC): NOT DETECTED
Giardia lamblia: NOT DETECTED
NOROVIRUS GI/GII: NOT DETECTED
PLESIMONAS SHIGELLOIDES: NOT DETECTED
Rotavirus A: NOT DETECTED
SAPOVIRUS (I, II, IV, AND V): NOT DETECTED
SHIGA LIKE TOXIN PRODUCING E COLI (STEC): NOT DETECTED
Salmonella species: NOT DETECTED
Shigella/Enteroinvasive E coli (EIEC): NOT DETECTED
Vibrio cholerae: NOT DETECTED
Vibrio species: NOT DETECTED
Yersinia enterocolitica: NOT DETECTED

## 2017-11-03 LAB — C DIFFICILE QUICK SCREEN W PCR REFLEX
C DIFFICILE (CDIFF) INTERP: NOT DETECTED
C DIFFICLE (CDIFF) ANTIGEN: NEGATIVE
C Diff toxin: NEGATIVE

## 2017-11-05 LAB — CALPROTECTIN, FECAL

## 2017-12-02 ENCOUNTER — Other Ambulatory Visit
Admission: RE | Admit: 2017-12-02 | Discharge: 2017-12-02 | Disposition: A | Discharge status: Home / Self Care | Payer: BLUE CROSS/BLUE SHIELD | Source: Ambulatory Visit | Attending: Gastroenterology | Admitting: Gastroenterology

## 2017-12-02 DIAGNOSIS — R197 Diarrhea, unspecified: Secondary | ICD-10-CM | POA: Insufficient documentation

## 2017-12-02 DIAGNOSIS — R1032 Left lower quadrant pain: Secondary | ICD-10-CM | POA: Diagnosis not present

## 2017-12-02 DIAGNOSIS — R1031 Right lower quadrant pain: Secondary | ICD-10-CM | POA: Insufficient documentation

## 2017-12-03 ENCOUNTER — Other Ambulatory Visit: Payer: Self-pay | Admitting: Gastroenterology

## 2017-12-03 DIAGNOSIS — K529 Noninfective gastroenteritis and colitis, unspecified: Secondary | ICD-10-CM

## 2017-12-03 DIAGNOSIS — R1084 Generalized abdominal pain: Secondary | ICD-10-CM

## 2017-12-03 DIAGNOSIS — R197 Diarrhea, unspecified: Secondary | ICD-10-CM

## 2017-12-07 ENCOUNTER — Ambulatory Visit: Payer: BLUE CROSS/BLUE SHIELD

## 2017-12-07 LAB — MISCELLANEOUS TEST

## 2017-12-08 ENCOUNTER — Ambulatory Visit: Payer: BLUE CROSS/BLUE SHIELD

## 2017-12-30 ENCOUNTER — Ambulatory Visit
Admission: RE | Admit: 2017-12-30 | Discharge: 2017-12-30 | Disposition: A | Discharge status: Home / Self Care | Payer: BLUE CROSS/BLUE SHIELD | Source: Ambulatory Visit | Attending: Gastroenterology | Admitting: Gastroenterology

## 2017-12-30 DIAGNOSIS — R1084 Generalized abdominal pain: Secondary | ICD-10-CM | POA: Diagnosis not present

## 2017-12-30 DIAGNOSIS — K529 Noninfective gastroenteritis and colitis, unspecified: Secondary | ICD-10-CM | POA: Insufficient documentation

## 2017-12-30 DIAGNOSIS — K760 Fatty (change of) liver, not elsewhere classified: Secondary | ICD-10-CM | POA: Insufficient documentation

## 2017-12-30 DIAGNOSIS — R197 Diarrhea, unspecified: Secondary | ICD-10-CM | POA: Diagnosis present

## 2017-12-30 MED ORDER — IOPAMIDOL (ISOVUE-300) INJECTION 61%
100.0000 mL | Freq: Once | INTRAVENOUS | Status: AC | PRN
  Administered 2017-12-30: 100 mL via INTRAVENOUS

## 2018-01-12 ENCOUNTER — Other Ambulatory Visit: Payer: Self-pay | Admitting: Gastroenterology

## 2018-01-12 DIAGNOSIS — K769 Liver disease, unspecified: Secondary | ICD-10-CM

## 2018-01-12 DIAGNOSIS — R935 Abnormal findings on diagnostic imaging of other abdominal regions, including retroperitoneum: Secondary | ICD-10-CM

## 2018-01-27 ENCOUNTER — Ambulatory Visit
Admission: RE | Admit: 2018-01-27 | Discharge: 2018-01-27 | Disposition: A | Discharge status: Home / Self Care | Payer: BLUE CROSS/BLUE SHIELD | Source: Ambulatory Visit | Attending: Gastroenterology | Admitting: Gastroenterology

## 2018-01-27 DIAGNOSIS — K769 Liver disease, unspecified: Secondary | ICD-10-CM

## 2018-01-27 DIAGNOSIS — R160 Hepatomegaly, not elsewhere classified: Secondary | ICD-10-CM | POA: Insufficient documentation

## 2018-01-27 DIAGNOSIS — R935 Abnormal findings on diagnostic imaging of other abdominal regions, including retroperitoneum: Secondary | ICD-10-CM

## 2018-01-27 MED ORDER — GADOXETATE DISODIUM 0.25 MMOL/ML IV SOLN
10.0000 mL | Freq: Once | INTRAVENOUS | Status: AC | PRN
  Administered 2018-01-27: 9 mL via INTRAVENOUS

## 2018-05-25 ENCOUNTER — Other Ambulatory Visit (HOSPITAL_COMMUNITY)
Admission: RE | Admit: 2018-05-25 | Discharge: 2018-05-25 | Disposition: A | Discharge status: Home / Self Care | Payer: BLUE CROSS/BLUE SHIELD | Source: Ambulatory Visit | Attending: Certified Nurse Midwife | Admitting: Certified Nurse Midwife

## 2018-05-25 ENCOUNTER — Ambulatory Visit (INDEPENDENT_AMBULATORY_CARE_PROVIDER_SITE_OTHER): Payer: BLUE CROSS/BLUE SHIELD | Admitting: Certified Nurse Midwife

## 2018-05-25 ENCOUNTER — Encounter: Payer: Self-pay | Admitting: Certified Nurse Midwife

## 2018-05-25 VITALS — BP 106/74 | HR 68 | Ht 69.0 in | Wt 210.1 lb

## 2018-05-25 DIAGNOSIS — Z202 Contact with and (suspected) exposure to infections with a predominantly sexual mode of transmission: Secondary | ICD-10-CM | POA: Diagnosis not present

## 2018-05-25 NOTE — Progress Notes (Signed)
GYN ENCOUNTER NOTE  Subjective:       Rachael Bennett is a 33 y.o. G0P0000 female is here for gynecologic evaluation of the following issues:  1. Possible exposure to STD, states her ex cheated on her and she would like to have STD testing. She has a new partner and is going to try to get pregnant in near future.    Gynecologic History Patient's last menstrual period was 05/16/2018 (exact date). Contraception: none Last Pap: 09/02/2016. Results were: normal, HPV neg Last mammogram: N/A.   Obstetric History OB History  Gravida Para Term Preterm AB Living  0 0 0 0 0 0  SAB TAB Ectopic Multiple Live Births  0 0 0 0      Past Medical History:  Diagnosis Date  . Acid reflux   . Allergy   . Bipolar disorder (HCC)   . Dysmenorrhea   . Headache   . Hip pain   . Knee pain   . Migraine   . Seasonal allergies     Past Surgical History:  Procedure Laterality Date  . BREAST RECONSTRUCTION     x 3  . PLACEMENT OF BREAST IMPLANTS    . TONSILLECTOMY      Current Outpatient Medications on File Prior to Visit  Medication Sig Dispense Refill  . aspirin-acetaminophen-caffeine (EXCEDRIN MIGRAINE) 250-250-65 MG tablet Take by mouth.    . cyclobenzaprine (FLEXERIL) 10 MG tablet TK 1 TO 2 TS PO QHS  3  . dicyclomine (BENTYL) 10 MG capsule Take by mouth.    . fluticasone (FLONASE) 50 MCG/ACT nasal spray Place 2 sprays into both nostrils daily. 16 g 11  . lactase (LACTAID) 3000 units tablet Take by mouth.    Marland Kitchen LATUDA 40 MG TABS tablet TK 1 T PO  QD WITH EVENING MEAL    . LATUDA 60 MG TABS TK 1 T PO Q EVENING WITH MEAL    . loratadine (CLARITIN) 10 MG tablet Take 1 tablet (10 mg total) by mouth daily. 30 tablet 0  . Melatonin 5 MG TABS Take 5 mg by mouth.    . pantoprazole (PROTONIX) 40 MG tablet Take by mouth.    . propranolol (INDERAL) 80 MG tablet   11  . SUMAtriptan (IMITREX) 25 MG tablet   11  . Vitamins/Minerals TABS Take 1 tablet by mouth daily.    Marland Kitchen albuterol (PROVENTIL  HFA;VENTOLIN HFA) 108 (90 Base) MCG/ACT inhaler Inhale into the lungs.     No current facility-administered medications on file prior to visit.     No Known Allergies  Social History   Socioeconomic History  . Marital status: Married    Spouse name: Not on file  . Number of children: Not on file  . Years of education: Not on file  . Highest education level: Not on file  Occupational History  . Not on file  Social Needs  . Financial resource strain: Not on file  . Food insecurity:    Worry: Not on file    Inability: Not on file  . Transportation needs:    Medical: Not on file    Non-medical: Not on file  Tobacco Use  . Smoking status: Never Smoker  . Smokeless tobacco: Never Used  Substance and Sexual Activity  . Alcohol use: Yes    Comment: occas  . Drug use: No  . Sexual activity: Yes    Birth control/protection: Pill  Lifestyle  . Physical activity:    Days per week: Not  on file    Minutes per session: Not on file  . Stress: Not on file  Relationships  . Social connections:    Talks on phone: Not on file    Gets together: Not on file    Attends religious service: Not on file    Active member of club or organization: Not on file    Attends meetings of clubs or organizations: Not on file    Relationship status: Not on file  . Intimate partner violence:    Fear of current or ex partner: Not on file    Emotionally abused: Not on file    Physically abused: Not on file    Forced sexual activity: Not on file  Other Topics Concern  . Not on file  Social History Narrative  . Not on file    Family History  Problem Relation Age of Onset  . Diabetes Father   . Hypertension Father   . Hyperlipidemia Father   . Heart disease Father   . Heart disease Mother        pacemaker (p-28) 04/04/2016  . Cancer Maternal Grandfather        lung    The following portions of the patient's history were reviewed and updated as appropriate: allergies, current medications, past  family history, past medical history, past social history, past surgical history and problem list.  Review of Systems Review of Systems - Negative except as mentioned in HPI Review of Systems - General ROS: negative for - chills, fatigue, fever, hot flashes, malaise or night sweats Hematological and Lymphatic ROS: negative for - bleeding problems or swollen lymph nodes Gastrointestinal ROS: negative for - abdominal pain, blood in stools, change in bowel habits and nausea/vomiting Musculoskeletal ROS: negative for - joint pain, muscle pain or muscular weakness Genito-Urinary ROS: negative for - change in menstrual cycle, dysmenorrhea, dyspareunia, dysuria, genital discharge, genital ulcers, hematuria, incontinence, irregular/heavy menses, nocturia or pelvic pain.  Objective:   BP 106/74   Pulse 68   Ht 5\' 9"  (1.753 m)   Wt 210 lb 1 oz (95.3 kg)   LMP 05/16/2018 (Exact Date)   BMI 31.02 kg/m  CONSTITUTIONAL: Well-developed, well-nourished female in no acute distress.  HENT:  Normocephalic, atraumatic.  NECK: Normal range of motion, supple, no masses.  Normal thyroid.  SKIN: Skin is warm and dry. No rash noted. Not diaphoretic. No erythema. No pallor. NEUROLGIC: Alert and oriented to person, place, and time. PSYCHIATRIC: Normal mood and affect. Normal behavior. Normal judgment and thought content. CARDIOVASCULAR:Not Examined RESPIRATORY: Not Examined BREASTS: Not Examined ABDOMEN: Soft, non distended; Non tender.  No Organomegaly. PELVIC:  External Genitalia: Normal  BUS: Normal  Vagina: Normal, white discharge no odor  Cervix: Normal  Uterus: Normal size, shape,consistency, mobile  Adnexa: Normal  RV: Normal   Bladder: Nontender MUSCULOSKELETAL: Normal range of motion. No tenderness.  No cyanosis, clubbing, or edema.   Assessment:   Possible exposure to STD   Plan:   Blood work and swab today for std testing. Preconception counseling. Discussed medications, diet, exercise  , and use of PNV. Will follow up with lab results. Return PRN.   Doreene BurkeAnnie Kenzy Campoverde, CNM

## 2018-05-25 NOTE — Patient Instructions (Signed)

## 2018-05-26 LAB — CERVICOVAGINAL ANCILLARY ONLY
Chlamydia: NEGATIVE
NEISSERIA GONORRHEA: NEGATIVE
TRICH (WINDOWPATH): NEGATIVE

## 2018-05-26 LAB — HSV 1 AND 2 IGM ABS, INDIRECT
HSV 1 IgM: <1:10 {titer}
HSV 2 IgM: <1:10 {titer}

## 2018-05-26 LAB — HIV ANTIBODY (ROUTINE TESTING W REFLEX): HIV Screen 4th Generation wRfx: NONREACTIVE

## 2018-05-26 LAB — RPR: RPR Ser Ql: NONREACTIVE

## 2018-05-26 LAB — HEPATITIS B SURFACE ANTIGEN: Hepatitis B Surface Ag: NEGATIVE

## 2018-05-27 ENCOUNTER — Telehealth: Payer: Self-pay

## 2018-05-27 ENCOUNTER — Telehealth: Payer: Self-pay | Admitting: Certified Nurse Midwife

## 2018-05-27 NOTE — Telephone Encounter (Signed)
A call was placed to [facility]- HSV 1 &2 was marked on order that included gcc etc. Per lab-HSV 1&2 can be ordered from a thin prep. HSV 1&2 was done as a blood draw. The call from Wandra Mannan was an [place].

## 2018-05-27 NOTE — Telephone Encounter (Signed)
Per [facility], it appears an Aptima swab was used to run the herpes 1 & 2 but can only be done on Thin Prep per Tameka at the lab; therefore, herpes test not done.  Please advise, thanks.

## 2018-05-27 NOTE — Addendum Note (Signed)
Addended by: Brooke Dare on: 05/27/2018 12:01 PM   Modules accepted: Orders

## 2019-01-12 ENCOUNTER — Encounter: Payer: Self-pay | Admitting: Certified Nurse Midwife

## 2019-01-12 ENCOUNTER — Other Ambulatory Visit (HOSPITAL_COMMUNITY)
Admission: RE | Admit: 2019-01-12 | Discharge: 2019-01-12 | Disposition: A | Discharge status: Home / Self Care | Payer: BC Managed Care – PPO | Source: Ambulatory Visit | Attending: Certified Nurse Midwife | Admitting: Certified Nurse Midwife

## 2019-01-12 ENCOUNTER — Other Ambulatory Visit: Payer: Self-pay

## 2019-01-12 ENCOUNTER — Ambulatory Visit (INDEPENDENT_AMBULATORY_CARE_PROVIDER_SITE_OTHER): Payer: BC Managed Care – PPO | Admitting: Certified Nurse Midwife

## 2019-01-12 VITALS — BP 105/74 | HR 81 | Ht 69.0 in | Wt 220.5 lb

## 2019-01-12 DIAGNOSIS — N941 Unspecified dyspareunia: Secondary | ICD-10-CM | POA: Insufficient documentation

## 2019-01-12 DIAGNOSIS — R300 Dysuria: Secondary | ICD-10-CM | POA: Diagnosis not present

## 2019-01-12 LAB — POCT URINALYSIS DIPSTICK
Bilirubin, UA: NEGATIVE
Blood, UA: NEGATIVE
Glucose, UA: NEGATIVE
Ketones, UA: NEGATIVE
Leukocytes, UA: NEGATIVE
Nitrite, UA: NEGATIVE
Protein, UA: NEGATIVE
Spec Grav, UA: 1.015 (ref 1.010–1.025)
Urobilinogen, UA: 0.2 E.U./dL
pH, UA: 5 (ref 5.0–8.0)

## 2019-01-12 MED ORDER — PHENAZOPYRIDINE HCL 200 MG PO TABS: 200.0000 mg | ORAL_TABLET | Freq: Three times a day (TID) | ORAL | 1 refills | Status: DC | PRN

## 2019-01-12 NOTE — Progress Notes (Signed)
GYN ENCOUNTER NOTE  Subjective:       Rachael Bennett is a 33 y.o. [place] female is here for gynecologic evaluation of the following issues:  1. Dysuria 2. Dyspareunia-pain with thrusting only  Reports symptoms for the last week. No relief with home treatment measures.   Notes new vibrator that she uses multiple times a day, questions if this could be the source for her symptoms.   Denies difficulty breathing or respiratory distress, chest pain, abdominal pain, excessive vaginal bleeding, vaginal discharge or odor, and leg pain or swelling.    Gynecologic History  Patient's last menstrual period was 12/08/2018 (approximate).  Contraception: none  Last Pap: 09/2016. Results were: Negative/Negative  Obstetric History  OB History  Gravida Para Term Preterm AB Living  0 0 0 0 0 0  SAB TAB Ectopic Multiple Live Births  0 0 0 0      Past Medical History:  Diagnosis Date  . Acid reflux   . Allergy   . Bipolar disorder ([place])   . Dysmenorrhea   . Headache   . Hip pain   . Knee pain   . Migraine   . Seasonal allergies     Past Surgical History:  Procedure Laterality Date  . BREAST RECONSTRUCTION     x 3  . BREAST RECONSTRUCTION Left   . PLACEMENT OF BREAST IMPLANTS    . TONSILLECTOMY      Current Outpatient Medications on File Prior to Visit  Medication Sig Dispense Refill  . LATUDA 40 MG TABS tablet TK 1 T PO  QD WITH EVENING MEAL    . Melatonin 5 MG TABS Take 5 mg by mouth.    Marland Kitchen albuterol (PROVENTIL HFA;VENTOLIN HFA) 108 (90 Base) MCG/ACT inhaler Inhale into the lungs.    Marland Kitchen aspirin-acetaminophen-caffeine (EXCEDRIN MIGRAINE) 250-250-65 MG tablet Take by mouth.    . cyclobenzaprine (FLEXERIL) 10 MG tablet TK 1 TO 2 TS PO QHS  3  . dicyclomine (BENTYL) 10 MG capsule Take by mouth.    . fluticasone (FLONASE) 50 MCG/ACT nasal spray Place 2 sprays into both nostrils daily. (Patient not taking: Reported on 01/12/2019) 16 g 11  . lactase (LACTAID) 3000 units tablet  Take by mouth.    Marland Kitchen LATUDA 60 MG TABS TK 1 T PO Q EVENING WITH MEAL    . loratadine (CLARITIN) 10 MG tablet Take 1 tablet (10 mg total) by mouth daily. (Patient not taking: Reported on 01/12/2019) 30 tablet 0  . pantoprazole (PROTONIX) 40 MG tablet Take by mouth.    . propranolol (INDERAL) 80 MG tablet   11  . SUMAtriptan (IMITREX) 25 MG tablet   11  . Vitamins/Minerals TABS Take 1 tablet by mouth daily.     No current facility-administered medications on file prior to visit.     No Known Allergies  Social History   Socioeconomic History  . Marital status: Married    Spouse name: Not on file  . Number of children: Not on file  . Years of education: Not on file  . Highest education level: Not on file  Occupational History  . Not on file  Social Needs  . Financial resource strain: Not on file  . Food insecurity    Worry: Not on file    Inability: Not on file  . Transportation needs    Medical: Not on file    Non-medical: Not on file  Tobacco Use  . Smoking status: Never Smoker  . Smokeless tobacco:  Never Used  Substance and Sexual Activity  . Alcohol use: Not Currently    Comment: occas  . Drug use: No  . Sexual activity: Yes    Birth control/protection: None  Lifestyle  . Physical activity    Days per week: Not on file    Minutes per session: Not on file  . Stress: Not on file  Relationships  . Social Musicianconnections    Talks on phone: Not on file    Gets together: Not on file    Attends religious service: Not on file    Active member of club or organization: Not on file    Attends meetings of clubs or organizations: Not on file    Relationship status: Not on file  . Intimate partner violence    Fear of current or ex partner: Not on file    Emotionally abused: Not on file    Physically abused: Not on file    Forced sexual activity: Not on file  Other Topics Concern  . Not on file  Social History Narrative  . Not on file    Family History  Problem Relation  Age of Onset  . Diabetes Father   . Hypertension Father   . Hyperlipidemia Father   . Heart disease Father   . Heart disease Mother        pacemaker (p-28) 04/04/2016  . Cancer Maternal Grandfather        lung    The following portions of the patient's history were reviewed and updated as appropriate: allergies, current medications, past family history, past medical history, past social history, past surgical history and problem list.  Review of Systems  ROS negative except as noted above. Information obtained from patient.   Objective:   BP 105/74   Pulse 81   Ht 5\' 9"  (1.753 m)   Wt 220 lb 8 oz (100 kg)   LMP 12/08/2018 (Approximate)   BMI 32.56 kg/m   CONSTITUTIONAL: Well-developed, well-nourished female in no acute distress.   ABDOMEN: Soft, non distended; Non tender.  No Organomegaly.  PELVIC:  External Genitalia: Normal  Vagina: Normal, Vaginal swab collected  Cervix: No CMT  Uterus: Normal size, shape,consistency, mobile  Adnexa: Normal   MUSCULOSKELETAL: Normal range of motion. No tenderness.  No cyanosis, clubbing, or edema.  Assessment:   1. Dysuria  - Urine Culture - POCT urinalysis dipstick  2. Dyspareunia, female  - Cervicovaginal ancillary only   Plan:   Rx: Pyridium, see orders.  Labs: see orders. Will contact patient with results.   Reviewed red flag symptoms and when to call.   RTC as needed.    Gunnar BullaJenkins Michelle Dafney Farler, CNM [facility], [facility]

## 2019-01-12 NOTE — Patient Instructions (Signed)
Dyspareunia, Female Dyspareunia is pain that is associated with sexual activity. This can affect any part of the genitals or lower abdomen. There are many possible causes of this condition. In some cases, diagnosing the cause of dyspareunia can be difficult. This condition can be mild, moderate, or severe. Depending on the cause, dyspareunia may get better with treatment, but may return (recur) over time. What are the causes?  The cause of this condition is not always known. However, problems that affect the vulva, vagina, uterus, and other organs may cause dyspareunia. Common causes of this condition include:  Vaginal dryness.  Giving birth.  Infection.  Skin changes or conditions.  Side effects of medicines.  Endometriosis. This is when tissue that is like the lining of the uterus grows on the outside of the uterus.  Psychological conditions. These include depression, anxiety, or traumatic experiences.  Allergic reaction. What increases the risk? The following factors may make you more likely to develop this condition:  History of physical or sexual trauma.  Some medicines.  No longer having a monthly period (menopause).  Having recently given birth.  Taking baths using soaps that have perfumes. These can cause irritation.  Douching. What are the signs or symptoms? The main symptom of this condition is pain in any part of your genitals or lower abdomen during or after sex. This may include:  Irritation, burning, or stinging sensations in your vulva.  Discomfort when your vulva or surrounding area is touched.  Aching and throbbing pain that may be constant.  Pain that gets worse when something is inserted into your vagina. How is this diagnosed? This condition may be diagnosed based on:  Your symptoms, including where and when your pain occurs.  Your medical history.  A physical exam. A pelvic exam will most likely be done.  Tests that include ultrasound,  blood tests, and tests that check the body for infection.  Imaging tests, such as X-ray, MRI, and CT scan. You may be referred to a health care provider who specializes in women's health (gynecologist). How is this treated? Treatment depends on the cause of your condition and your symptoms. In most cases, you may need to stop sexual activity until your symptoms go away or get better. Treatment may include:  Lubricants, ointments, and creams.  Physical therapy.  Massage therapy.  Hormonal therapy.  Medicines to: ? Prevent or fight infection. ? Relieve pain. ? Help numb the area. ? Treat depression (antidepressants).  Counseling, which may include sex therapy.  Surgery. Follow these instructions at home: Lifestyle  Wear cotton underwear.  Use water-based lubricants as needed during sex. Avoid oil-based lubricants.  Do not use any products that can cause irritation. This may include certain condoms, spermicides, lubricants, soaps, tampons, vaginal sprays, or douches.  Always practice safe sex. Use a condom to prevent sexually transmitted infections (STIs).  Talk freely with your partner about your condition. General instructions  Take or apply over-the-counter and prescription medicines only as told by your health care provider.  Urinate before you have sex.  Consider joining a support group.  Get the results of any tests you have done. Ask your health care provider, or the department that is doing the procedure, when your results will be ready.  Keep all follow-up visits as told by your health care provider. This is important. Contact a health care provider if:  You have vaginal bleeding after having sex.  You develop a lump at the opening of your vagina even if the   lump is painless.  You have: ? Abnormal discharge from your vagina. ? Vaginal dryness. ? Itchiness or irritation of your vulva or vagina. ? A new rash. ? Symptoms that get worse or do not improve  with treatment. ? A fever. ? Pain when you urinate. ? Blood in your urine. Get help right away if:  You have severe pain in your abdomen during or shortly after sex.  You pass out after sex. Summary  Dyspareunia is pain that is associated with sexual activity. This can affect any part of the genitals or lower abdomen.  There are many causes of this condition. Treatment depends on the cause and your symptoms. In most cases, you may need to stop sexual activity until your symptoms improve.  Take or apply over-the-counter and prescription medicines only as told by your health care provider.  Contact a health care provider if your symptoms get worse or do not improve with treatment.  Keep all follow-up visits as told by your health care provider. This is important. This information is not intended to replace advice given to you by your health care provider. Make sure you discuss any questions you have with your health care provider. Document Released: 05/10/2007 Document Revised: 06/27/2018 Document Reviewed: 06/27/2018 Elsevier Patient Education  [place]. Dysuria Dysuria is pain or discomfort while urinating. The pain or discomfort may be felt in the part of your body that drains urine from the bladder (urethra) or in the surrounding tissue of the genitals. The pain may also be felt in the groin area, lower abdomen, or lower back. You may have to urinate frequently or have the sudden feeling that you have to urinate (urgency). Dysuria can affect both men and women, but it is more common in women. Dysuria can be caused by many different things, including:  Urinary tract infection.  Kidney stones or bladder stones.  Certain sexually transmitted infections (STIs), such as chlamydia.  Dehydration.  Inflammation of the tissues of the vagina.  Use of certain medicines.  Use of certain soaps or scented products that cause irritation. Follow these instructions at home:  General instructions  Watch your condition for any changes.  Urinate often. Avoid holding urine for long periods of time.  After a bowel movement or urination, women should cleanse from front to back, using each tissue only once.  Urinate after sexual intercourse.  Keep all follow-up visits as told by your health care provider. This is important.  If you had any tests done to find the cause of dysuria, it is up to you to get your test results. Ask your health care provider, or the department that is doing the test, when your results will be ready. Eating and drinking   Drink enough fluid to keep your urine pale yellow.  Avoid caffeine, tea, and alcohol. They can irritate the bladder and make dysuria worse. In men, alcohol may irritate the prostate. Medicines  Take over-the-counter and prescription medicines only as told by your health care provider.  If you were prescribed an antibiotic medicine, take it as told by your health care provider. Do not stop taking the antibiotic even if you start to feel better. Contact a health care provider if:  You have a fever.  You develop pain in your back or sides.  You have nausea or vomiting.  You have blood in your urine.  You are not urinating as often as you usually do. Get help right away if:  Your pain is severe  and not relieved with medicines.  You cannot eat or drink without vomiting.  You are confused.  You have a rapid heartbeat while at rest.  You have shaking or chills.  You feel extremely weak. Summary  Dysuria is pain or discomfort while urinating. Many different conditions can lead to dysuria.  If you have dysuria, you may have to urinate frequently or have the sudden feeling that you have to urinate (urgency).  Watch your condition for any changes. Keep all follow-up visits as told by your health care provider.  Make sure that you urinate often and drink enough fluid to keep your urine pale yellow. This  information is not intended to replace advice given to you by your health care provider. Make sure you discuss any questions you have with your health care provider. Document Released: 01/17/2004 Document Revised: 04/02/2017 Document Reviewed: 02/04/2017 Elsevier Patient Education  2020 [employer]

## 2019-01-14 LAB — URINE CULTURE

## 2019-01-17 LAB — CERVICOVAGINAL ANCILLARY ONLY
Bacterial vaginitis: NEGATIVE
Candida vaginitis: NEGATIVE
Chlamydia: NEGATIVE
Neisseria Gonorrhea: NEGATIVE
Trichomonas: NEGATIVE

## 2019-01-20 ENCOUNTER — Encounter: Payer: Self-pay | Admitting: Certified Nurse Midwife

## 2019-01-23 ENCOUNTER — Other Ambulatory Visit: Payer: Self-pay

## 2019-01-23 MED ORDER — NITROFURANTOIN MONOHYD MACRO 100 MG PO CAPS: 100.0000 mg | ORAL_CAPSULE | Freq: Two times a day (BID) | ORAL | 0 refills | Status: AC

## 2019-07-18 ENCOUNTER — Ambulatory Visit (INDEPENDENT_AMBULATORY_CARE_PROVIDER_SITE_OTHER): Payer: BC Managed Care – PPO | Admitting: Certified Nurse Midwife

## 2019-07-18 ENCOUNTER — Encounter: Payer: Self-pay | Admitting: Certified Nurse Midwife

## 2019-07-18 ENCOUNTER — Other Ambulatory Visit: Payer: Self-pay

## 2019-07-18 ENCOUNTER — Other Ambulatory Visit: Payer: Self-pay | Admitting: Certified Nurse Midwife

## 2019-07-18 VITALS — BP 130/86 | HR 74 | Ht 69.0 in | Wt 227.0 lb

## 2019-07-18 DIAGNOSIS — Z23 Encounter for immunization: Secondary | ICD-10-CM | POA: Diagnosis not present

## 2019-07-18 DIAGNOSIS — E669 Obesity, unspecified: Secondary | ICD-10-CM | POA: Diagnosis not present

## 2019-07-18 DIAGNOSIS — N979 Female infertility, unspecified: Secondary | ICD-10-CM | POA: Diagnosis not present

## 2019-07-18 DIAGNOSIS — N926 Irregular menstruation, unspecified: Secondary | ICD-10-CM | POA: Diagnosis not present

## 2019-07-18 MED ORDER — METFORMIN HCL 500 MG PO TABS: 500.0000 mg | ORAL_TABLET | Freq: Two times a day (BID) | ORAL | 1 refills | Status: DC

## 2019-07-18 MED ORDER — CLOMIPHENE CITRATE 50 MG PO TABS: 50.0000 mg | ORAL_TABLET | Freq: Every day | ORAL | 0 refills | Status: DC

## 2019-07-18 NOTE — Patient Instructions (Signed)
Female Infertility  Female infertility refers to a woman's inability to get pregnant (conceive) after a year of having sex regularly (or after 6 months in women over age 35) without using birth control. Infertility can also mean that a woman is not able to carry a pregnancy to full term. Both women and men can have fertility problems. What are the causes? This condition may be caused by:  Problems with reproductive organs. Infertility can result if a woman: ? Has an abnormally short cervix or a cervix that does not remain closed during a pregnancy. ? Has a blockage or scarring in the fallopian tubes. ? Has an abnormally shaped uterus. ? Has uterine fibroids. This is a benign mass of tissue or muscle (tumor) that can develop in the uterus. ? Is not ovulating in a regular way.  Certain medical conditions. These may include: ? Polycystic ovary syndrome (PCOS). This is a hormonal disorder that can cause small cysts to grow on the ovaries. This is the most common cause of infertility in women. ? Endometriosis. This is a condition in which the tissue that lines the uterus (endometrium) grows outside of its normal location. ? Cancer and cancer treatments, such as chemotherapy or radiation. ? Premature ovarian failure. This is when ovaries stop producing eggs and hormones before age 40. ? Sexually transmitted diseases, such as chlamydia or gonorrhea. ? Autoimmune disorders. These are disorders in which the body's defense system (immune system) attacks normal, healthy cells. Infertility can be linked to more than one cause. For some women, the cause of infertility is not known (unexplained infertility). What increases the risk?  Age. A woman's fertility declines with age, especially after her mid-30s.  Being underweight or overweight.  Drinking too much alcohol.  Using drugs such as anabolic steroids, cocaine, and marijuana.  Exercising excessively.  Being exposed to environmental toxins,  such as radiation, pesticides, and certain chemicals. What are the signs or symptoms? The main sign of infertility in women is the inability to get pregnant or carry a pregnancy to full term. How is this diagnosed? This condition may be diagnosed by:  Checking whether you are ovulating each month. The tests may include: ? Blood tests to check hormone levels. ? An ultrasound of the ovaries. ? Taking a small tissue that lines the uterus and checking it under a microscope (endometrial biopsy).  Doing additional tests. This is done if ovulation is normal. Tests may include: ? Hysterosalpingography. This X-ray test can show the shape of the uterus and whether the fallopian tubes are open. ? Laparoscopy. This test uses a lighted tube (laparoscope) to look for problems in the fallopian tubes and other organs. ? Transvaginal ultrasound. This imaging test is used to check for abnormalities in the uterus and ovaries. ? Hysteroscopy. This test uses a lighted tube to check for problems in the cervix and the uterus. To be diagnosed with infertility, both partners will have a physical exam. Both partners will also have an extensive medical and sexual history taken. Additional tests may be done. How is this treated? Treatment depends on the cause of infertility. Most cases of infertility in women are treated with medicine or surgery.  Women may take medicine to: ? Correct ovulation problems. ? Treat other health conditions.  Surgery may be done to: ? Repair damage to the ovaries, fallopian tubes, cervix, or uterus. ? Remove growths from the uterus. ? Remove scar tissue from the uterus, pelvis, or other organs. Assisted reproductive technology (ART) Assisted reproductive technology (  ART) refers to all treatments and procedures that combine eggs and sperm outside the body to try to help a couple conceive. ART is often combined with fertility drugs to stimulate ovulation. Sometimes ART is done using eggs  retrieved from another woman's body (donor eggs) or from previously frozen fertilized eggs (embryos). There are different types of ART. These include:  Intrauterine insemination (IUI). A long, thin tube is used to place sperm directly into a woman's uterus. This procedure: ? Is effective for infertility caused by sperm problems, including low sperm count and low motility. ? Can be used in combination with fertility drugs.  In vitro fertilization (IVF). This is done when a woman's fallopian tubes are blocked or when a man has low sperm count. In this procedure: ? Fertility drugs are used to stimulate the ovaries to produce multiple eggs. ? Once mature, these eggs are removed from the body and combined with the sperm to be fertilized. ? The fertilized eggs are then placed into the woman's uterus. Follow these instructions at home:  Take over-the-counter and prescription medicines only as told by your health care provider.  Do not use any products that contain nicotine or tobacco, such as cigarettes and e-cigarettes. If you need help quitting, ask your health care provider.  If you drink alcohol, limit how much you have to 1 drink a day.  Make dietary changes to lose weight or maintain a healthy weight. Work with your health care provider and a dietitian to set a weight-loss goal that is healthy and reasonable for you.  Seek support from a counselor or support group to talk about your concerns related to infertility. Couples counseling may be helpful for you and your partner.  Practice stress reduction techniques that work well for you, such as regular physical activity, meditation, or deep breathing.  Keep all follow-up visits as told by your health care provider. This is important. Contact a health care provider if you:  Feel that stress is interfering with your life and relationships.  Have side effects from treatments for infertility. Summary  Female infertility refers to a woman's  inability to get pregnant (conceive) after a year of having sex regularly (or after 6 months in women over age 35) without using birth control.  To be diagnosed with infertility, both partners will have a physical exam. Both partners will also have an extensive medical and sexual history taken.  Seek support from a counselor or support group to talk about your concerns related to infertility. Couples counseling may be helpful for you and your partner. This information is not intended to replace advice given to you by your health care provider. Make sure you discuss any questions you have with your health care provider. Document Revised: 08/11/2018 Document Reviewed: 03/22/2017 Elsevier Patient Education  2020 [employer]  

## 2019-07-18 NOTE — Progress Notes (Signed)
Subjective:    Rachael Bennett is a 34 y.o. female who presents for evaluation of infertility. Patient and partner have been attempting conception for 1 year. Pregnancies with current partner: no.  Menstrual and Endocrine History LMP Patient's last menstrual period was 07/17/2019 (exact date).     Duration of flow 4-5 days  Heavy menses yes  Clots no  Intermenstrual bleeding no  Postcoital bleeding no  Dysmenorrhea in the past  Amenorrhea irregular cycles  Weight change no, obese  Hirsutism no  Balding no  Acne no  Galactorrhea no   Obstetrical History Never pregnant  Gynecologic History Last PAP 09/02/2016  Previous abdominal or pelvic surgery no  Pelvic pain no  Endometriosis no  Hot flashes no  DES exposure no  Abnormal Pap no  Cervix Cryo/cone no  Sexually transmitted diseases chlamydia age 67 x 1   Pelvic inflammatory disease no   Infertility and Endocrine Studies Basal body temperature no  Endo with biopsy no  Hysterosalpingogram no  Post-coital test no  Laparoscopy no  Hormonal studies no  Semen analysis yes, WNL   Other studies no  Medications none  Other therapies none  Insemination no   Sexual History Difficulty predicting ovulation due to irregular cycle Contraception Oral contraceptives: Past: yes   Habits Cigarettes:    Wife -  no    Husband - no Alcohol:    Wife -  no    Husband - no Marijuana:   Wife - in the past   Husband - in the past  The following portions of the patient's history were reviewed and updated as appropriate: allergies, current medications, past family history, past medical history, past social history, past surgical history and problem list.  Review of Systems Pertinent items are noted in HPI.      Objective:    Female Exam BP 130/86   Pulse 74   Ht 5\' 9"  (1.753 m)   Wt 227 lb (103 kg)   LMP 07/17/2019 (Exact Date)   BMI 33.52 kg/m  Wt Readings from Last 1 Encounters:  07/18/19 227 lb (103 kg)    BMI: Body mass index is 33.52 kg/m. Not indicated to discuss fertility    Assessment:    Obesity, infertility  due to unknown.   Plan:    u/s ordered and blood work today  will follow up with results Encouraged weight loss to improve fertility. Discussed use of clomid and fermara to stimulation ovulation. Reviewed use, benefits and risks of medications. Orders placed for clomid and metformin. Instructions reviewed. Follow up ASAP for u/s and progesterone level on day 21 or 22 of cycle .   I attest more than 50% of visit spent reviewing history, discussing infertility, menstrual cycle, timing of intercourse, use of medications, risks and benefits. Face to face time 17 min.   07/20/19, CNM

## 2019-07-19 LAB — THYROID PANEL WITH TSH
Free Thyroxine Index: 1.6 (ref 1.2–4.9)
T3 Uptake Ratio: 21 % — ABNORMAL LOW (ref 24–39)
T4, Total: 7.8 ug/dL (ref 4.5–12.0)
TSH: 1.76 u[IU]/mL (ref 0.450–4.500)

## 2019-07-19 LAB — FSH/LH
FSH: 3.9 m[IU]/mL
LH: 3 m[IU]/mL

## 2019-07-19 LAB — PROLACTIN: Prolactin: 62.6 ng/mL — ABNORMAL HIGH (ref 4.8–23.3)

## 2019-07-19 LAB — TESTOSTERONE: Testosterone: 10 ng/dL (ref 8–48)

## 2019-07-25 ENCOUNTER — Other Ambulatory Visit: Payer: Self-pay

## 2019-07-25 ENCOUNTER — Ambulatory Visit (INDEPENDENT_AMBULATORY_CARE_PROVIDER_SITE_OTHER): Payer: BC Managed Care – PPO

## 2019-07-25 DIAGNOSIS — N979 Female infertility, unspecified: Secondary | ICD-10-CM | POA: Diagnosis not present

## 2019-07-25 DIAGNOSIS — N926 Irregular menstruation, unspecified: Secondary | ICD-10-CM | POA: Diagnosis not present

## 2019-07-27 ENCOUNTER — Ambulatory Visit: Payer: BC Managed Care – PPO

## 2019-08-23 ENCOUNTER — Ambulatory Visit (INDEPENDENT_AMBULATORY_CARE_PROVIDER_SITE_OTHER): Payer: BC Managed Care – PPO | Admitting: Certified Nurse Midwife

## 2019-08-23 ENCOUNTER — Other Ambulatory Visit: Payer: Self-pay

## 2019-08-23 ENCOUNTER — Encounter: Payer: Self-pay | Admitting: Certified Nurse Midwife

## 2019-08-23 VITALS — BP 111/74 | HR 83 | Ht 69.0 in | Wt 226.4 lb

## 2019-08-23 DIAGNOSIS — Z01419 Encounter for gynecological examination (general) (routine) without abnormal findings: Secondary | ICD-10-CM

## 2019-08-23 NOTE — Progress Notes (Signed)
GYNECOLOGY ANNUAL PREVENTATIVE CARE ENCOUNTER NOTE  History:     Rachael Bennett is a 34 y.o. G0P0000 female here for a routine annual gynecologic exam.  Current complaints: none   Denies abnormal vaginal bleeding, discharge, pelvic pain, problems with intercourse or other gynecologic concerns.  Pt has decided to pursue adoption. 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   Gynecologic History Patient's last menstrual period was 08/17/2019 (approximate). Contraception: none Last Pap: 09/02/2016. Results were: normal with negative HPV Last mammogram: n/a.   Obstetric History OB History  Gravida Para Term Preterm AB Living  0 0 0 0 0 0  SAB TAB Ectopic Multiple Live Births  0 0 0 0      Past Medical History:  Diagnosis Date  . Acid reflux   . Allergy   . Bipolar disorder (HCC)   . Dysmenorrhea   . Headache   . Hip pain   . Knee pain   . Migraine   . Seasonal allergies     Past Surgical History:  Procedure Laterality Date  . BREAST RECONSTRUCTION     x 3  . BREAST RECONSTRUCTION Left   . PLACEMENT OF BREAST IMPLANTS    . TONSILLECTOMY      Current Outpatient Medications on File Prior to Visit  Medication Sig Dispense Refill  . aspirin-acetaminophen-caffeine (EXCEDRIN MIGRAINE) 250-250-65 MG tablet Take by mouth.    . fluticasone (FLONASE) 50 MCG/ACT nasal spray Place 2 sprays into both nostrils daily. 16 g 11  . LATUDA 60 MG TABS TK 1 T PO Q EVENING WITH MEAL    . Melatonin 5 MG TABS Take 5 mg by mouth.    . metFORMIN (GLUCOPHAGE) 500 MG tablet TAKE 1 TABLET(500 MG) BY MOUTH TWICE DAILY WITH A MEAL 180 tablet 1  . Vitamins/Minerals TABS Take 1 tablet by mouth daily.    Marland Kitchen albuterol (PROVENTIL HFA;VENTOLIN HFA) 108 (90 Base) MCG/ACT inhaler Inhale into the lungs.    . Cetirizine HCl 10 MG CAPS  Take by mouth.    . clomiPHENE (CLOMID) 50 MG tablet Take 1 tablet (50 mg total) by mouth daily. Daily on day 3-7 of menstrual cycle (Patient not taking: Reported on 08/23/2019) 5 tablet 0  . cyclobenzaprine (FLEXERIL) 10 MG tablet TK 1 TO 2 TS PO QHS  3  . dicyclomine (BENTYL) 10 MG capsule Take by mouth.    . lactase (LACTAID) 3000 units tablet Take by mouth.    Marland Kitchen LATUDA 40 MG TABS tablet TK 1 T PO  QD WITH EVENING MEAL    . loratadine (CLARITIN) 10 MG tablet Take 1 tablet (10 mg total) by mouth daily. (Patient not taking: Reported on 01/12/2019) 30 tablet 0  . pantoprazole (PROTONIX) 40 MG tablet Take by mouth.    . phenazopyridine (PYRIDIUM) 200 MG tablet Take 1 tablet (200 mg total) by mouth 3 (three) times daily as needed for pain (urethral spasm). (Patient not taking: Reported on 07/18/2019) 10 tablet 1  . propranolol (INDERAL) 80 MG tablet   11  . SUMAtriptan (IMITREX) 25 MG tablet   11   No current facility-administered medications on file prior to visit.    No Known Allergies  Social History:  reports that she has never smoked. She has never used smokeless tobacco. She reports previous alcohol use. She reports that  she does not use drugs.  Family History  Problem Relation Age of Onset  . Diabetes Father   . Hypertension Father   . Hyperlipidemia Father   . Heart disease Father   . Heart disease Mother        pacemaker (p-28) 04/04/2016  . Cancer Maternal Grandfather        lung    The following portions of the patient's history were reviewed and updated as appropriate: allergies, current medications, past family history, past medical history, past social history, past surgical history and problem list.  Review of Systems Pertinent items noted in HPI and remainder of comprehensive ROS otherwise negative.  Physical Exam:  BP 111/74   Pulse 83   Ht 5\' 9"  (1.753 m)   Wt 226 lb 6 oz (102.7 kg)   LMP 08/17/2019 (Approximate)   BMI 33.43 kg/m  CONSTITUTIONAL:  Well-developed, well-nourished, obese female in no acute distress.  HENT:  Normocephalic, atraumatic, External right and left ear normal. Oropharynx is clear and moist EYES: Conjunctivae and EOM are normal. Pupils are equal, round, and reactive to light. No scleral icterus.  NECK: Normal range of motion, supple, no masses.  Normal thyroid.  SKIN: Skin is warm and dry. No rash noted. Not diaphoretic. No erythema. No pallor. MUSCULOSKELETAL: Normal range of motion. No tenderness.  No cyanosis, clubbing, or edema.  2+ distal pulses. NEUROLOGIC: Alert and oriented to person, place, and time. Normal reflexes, muscle tone coordination.  PSYCHIATRIC: Normal mood and affect. Normal behavior. Normal judgment and thought content. CARDIOVASCULAR: Normal heart rate noted, regular rhythm RESPIRATORY: Clear to auscultation bilaterally. Effort and breath sounds normal, no problems with respiration noted. BREASTS: Symmetric in size. No masses, tenderness, skin changes, nipple drainage, or lymphadenopathy bilaterally. Performed in the presence of a chaperone. Bilateral breast implants ABDOMEN: Soft, no distention noted.  No tenderness, rebound or guarding.  PELVIC: Normal appearing external genitalia and urethral meatus; normal appearing vaginal mucosa and cervix.  No abnormal discharge noted.  Pap smear not due.  Normal uterine size, no other palpable masses, no uterine or adnexal tenderness.   Assessment and Plan:    1. Women's annual routine gynecological examination  Pap smear not indicated Mammogram not indicated Labs: none Refills: none Referral: none Routine preventative health maintenance measures emphasized.Encouraged continued exercise and weight loss. Please refer to After Visit Summary for other counseling recommendations.      Philip Aspen, CNM

## 2019-08-23 NOTE — Patient Instructions (Signed)
Preventive Care 21-35 Years Old, Female Preventive care refers to visits with your health care provider and lifestyle choices that can promote health and wellness. This includes:  A yearly physical exam. This may also be called an annual well check.  Regular dental visits and eye exams.  Immunizations.  Screening for certain conditions.  Healthy lifestyle choices, such as eating a healthy diet, getting regular exercise, not using drugs or products that contain nicotine and tobacco, and limiting alcohol use. What can I expect for my preventive care visit? Physical exam Your health care provider will check your:  Height and weight. This may be used to calculate body mass index (BMI), which tells if you are at a healthy weight.  Heart rate and blood pressure.  Skin for abnormal spots. Counseling Your health care provider may ask you questions about your:  Alcohol, tobacco, and drug use.  Emotional well-being.  Home and relationship well-being.  Sexual activity.  Eating habits.  Work and work environment.  Method of birth control.  Menstrual cycle.  Pregnancy history. What immunizations do I need?  Influenza (flu) vaccine  This is recommended every year. Tetanus, diphtheria, and pertussis (Tdap) vaccine  You may need a Td booster every 10 years. Varicella (chickenpox) vaccine  You may need this if you have not been vaccinated. Human papillomavirus (HPV) vaccine  If recommended by your health care provider, you may need three doses over 6 months. Measles, mumps, and rubella (MMR) vaccine  You may need at least one dose of MMR. You may also need a second dose. Meningococcal conjugate (MenACWY) vaccine  One dose is recommended if you are age 19-21 years and a first-year college student living in a residence hall, or if you have one of several medical conditions. You may also need additional booster doses. Pneumococcal conjugate (PCV13) vaccine  You may need  this if you have certain conditions and were not previously vaccinated. Pneumococcal polysaccharide (PPSV23) vaccine  You may need one or two doses if you smoke cigarettes or if you have certain conditions. Hepatitis A vaccine  You may need this if you have certain conditions or if you travel or work in places where you may be exposed to hepatitis A. Hepatitis B vaccine  You may need this if you have certain conditions or if you travel or work in places where you may be exposed to hepatitis B. Haemophilus influenzae type b (Hib) vaccine  You may need this if you have certain conditions. You may receive vaccines as individual doses or as more than one vaccine together in one shot (combination vaccines). Talk with your health care provider about the risks and benefits of combination vaccines. What tests do I need?  Blood tests  Lipid and cholesterol levels. These may be checked every 5 years starting at age 20.  Hepatitis C test.  Hepatitis B test. Screening  Diabetes screening. This is done by checking your blood sugar (glucose) after you have not eaten for a while (fasting).  Sexually transmitted disease (STD) testing.  BRCA-related cancer screening. This may be done if you have a family history of breast, ovarian, tubal, or peritoneal cancers.  Pelvic exam and Pap test. This may be done every 3 years starting at age 21. Starting at age 30, this may be done every 5 years if you have a Pap test in combination with an HPV test. Talk with your health care provider about your test results, treatment options, and if necessary, the need for more tests.   Follow these instructions at home: Eating and drinking   Eat a diet that includes fresh fruits and vegetables, whole grains, lean protein, and low-fat dairy.  Take vitamin and mineral supplements as recommended by your health care provider.  Do not drink alcohol if: ? Your health care provider tells you not to drink. ? You are  pregnant, may be pregnant, or are planning to become pregnant.  If you drink alcohol: ? Limit how much you have to 0-1 drink a day. ? Be aware of how much alcohol is in your drink. In the [place] one drink equals one 12 oz bottle of beer (355 mL), one 5 oz glass of wine (148 mL), or one 1 oz glass of hard liquor (44 mL). Lifestyle  Take daily care of your teeth and gums.  Stay active. Exercise for at least 30 minutes on 5 or more days each week.  Do not use any products that contain nicotine or tobacco, such as cigarettes, e-cigarettes, and chewing tobacco. If you need help quitting, ask your health care provider.  If you are sexually active, practice safe sex. Use a condom or other form of birth control (contraception) in order to prevent pregnancy and STIs (sexually transmitted infections). If you plan to become pregnant, see your health care provider for a preconception visit. What's next?  Visit your health care provider once a year for a well check visit.  Ask your health care provider how often you should have your eyes and teeth checked.  Stay up to date on all vaccines. This information is not intended to replace advice given to you by your health care provider. Make sure you discuss any questions you have with your health care provider. Document Revised: 12/30/2017 Document Reviewed: 12/30/2017 Elsevier Patient Education  2020 [employer].

## 2020-09-12 IMAGING — MR MR ABDOMEN WO/W CM
14 of 15 series · 44 of 48 positions shown · IV contrast (9 ML EOVIST)
Comparison: CT on 12/30/2017

CLINICAL DATA: Left hepatic lobe mass on recent CT.

EXAM:
MRI ABDOMEN WITHOUT AND WITH CONTRAST
TECHNIQUE: Multiplanar multisequence MR imaging of the abdomen was performed
both before and after the administration of intravenous contrast.
CONTRAST:  9mL EOVIST GADOXETATE DISODIUM 0.25 MOL/L IV SOLN

[Series 2: cor ssfse / · coronal · 7.0mm · 1.48mm/px · 1 of 30 slices shown]
[im 1/30]
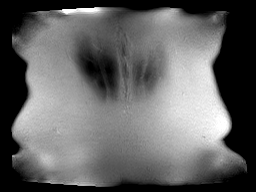

[Series 3: T1 · axial · 6.0mm · 0.74mm/px · z∈[-159,+100]mm · 2 of 74 slices shown]
[im 1/74]
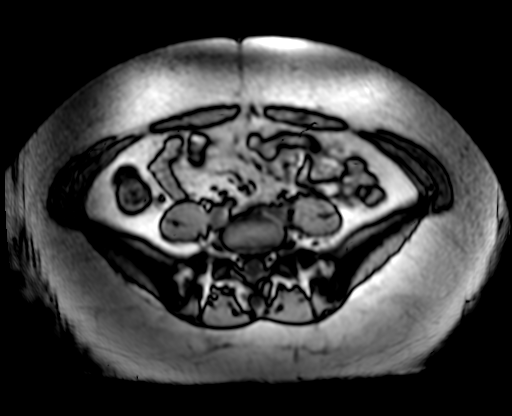
[im 74/74]
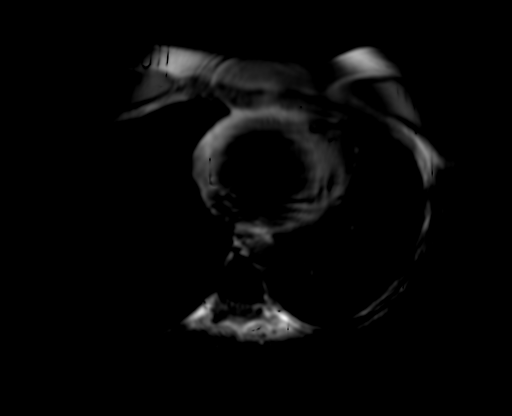

[Series 4: axial dynamic pre · axial · non-contrast · 3.0mm · 1.19mm/px · z∈[-166,+95]mm · 3 of 88 slices shown]
[im 1/88]
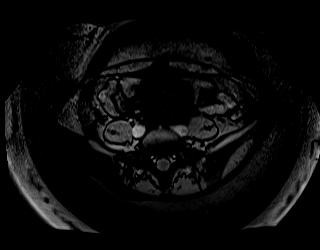
[im 44/88]
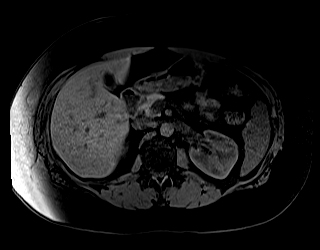
[im 88/88]
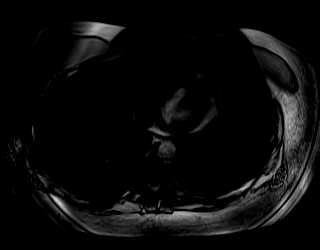

[Series 5: axial dynamic post · axial · 3.0mm · 1.19mm/px · z∈[-166,+95]mm · 4 of 88 slices shown (1 of 6)]
[im 1/88]
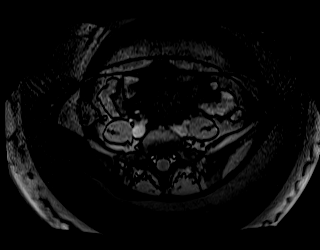
[im 30/88]
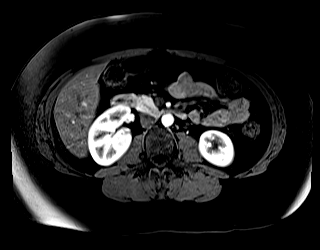
[im 59/88]
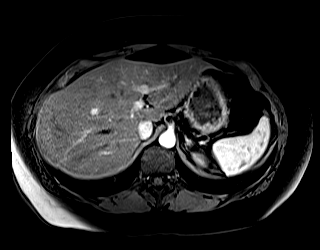
[im 88/88]
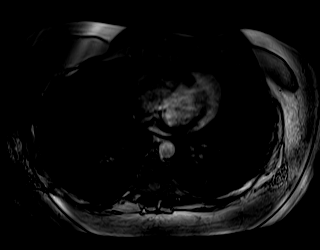

[Series 6: axial dynamic post · axial · 3.0mm · 1.19mm/px · z∈[-166,+95]mm · 4 of 88 slices shown (2 of 6)]
[im 1/88]
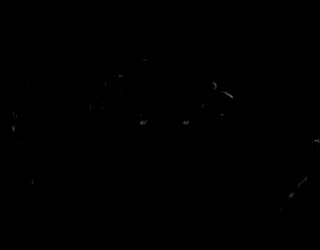
[im 30/88]
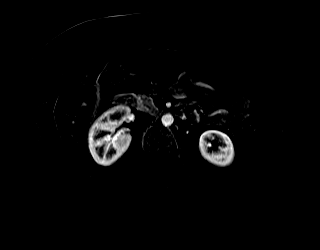
[im 59/88]
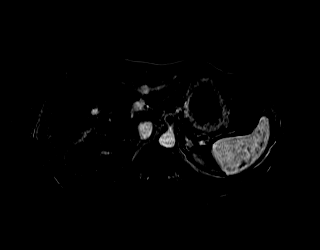
[im 88/88]
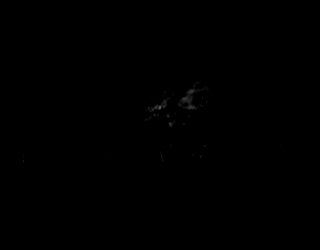

[Series 7: axial dynamic post · axial · 3.0mm · 1.19mm/px · z∈[-166,+95]mm · 4 of 88 slices shown (3 of 6)]
[im 1/88]
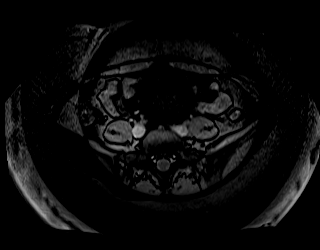
[im 30/88]
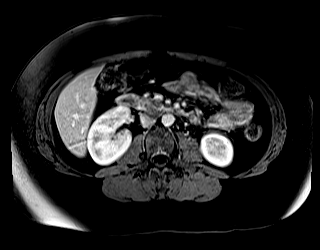
[im 59/88]
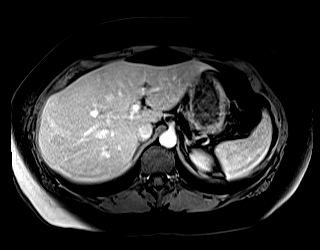
[im 88/88]
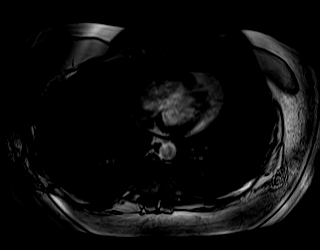

[Series 8: axial dynamic post · axial · 3.0mm · 1.19mm/px · z∈[-166,+95]mm · 4 of 88 slices shown (4 of 6)]
[im 1/88]
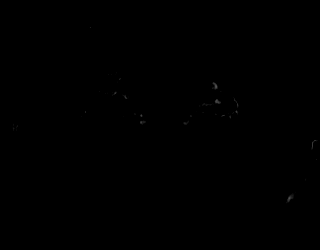
[im 30/88]
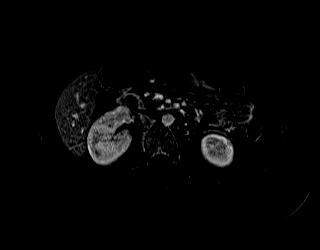
[im 59/88]
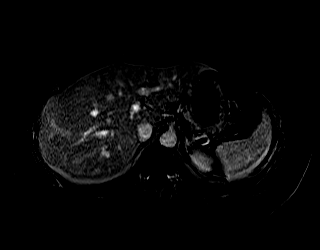
[im 88/88]
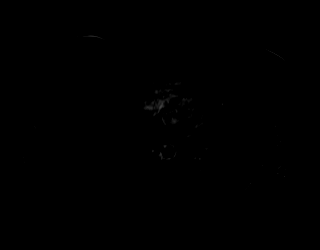

[Series 9: axial dynamic post · axial · 3.0mm · 1.19mm/px · z∈[-166,+95]mm · 4 of 88 slices shown (5 of 6)]
[im 1/88]
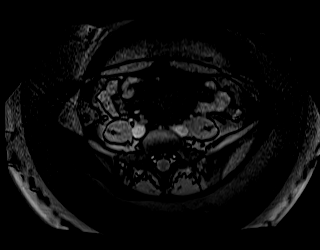
[im 30/88]
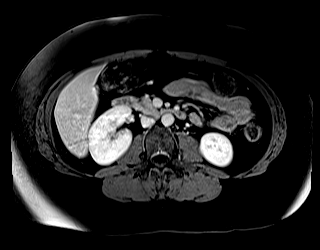
[im 59/88]
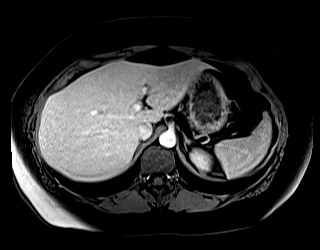
[im 88/88]
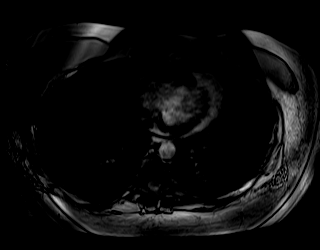

[Series 10: axial dynamic post · axial · 3.0mm · 1.19mm/px · z∈[-166,+95]mm · 4 of 88 slices shown (6 of 6)]
[im 1/88]
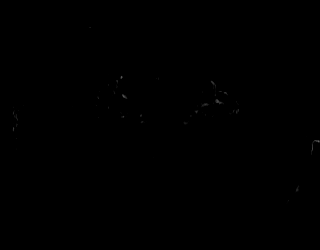
[im 30/88]
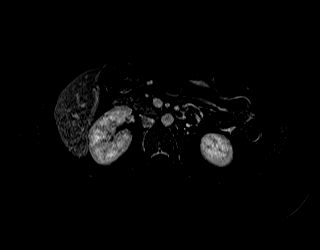
[im 59/88]
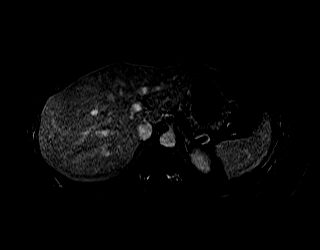
[im 88/88]
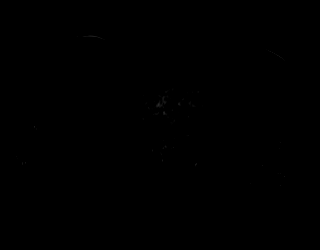

[Series 13: bSSFP · axial · 6.0mm · 0.74mm/px · z∈[-154,+91]mm · 2 of 35 slices shown]
[im 1/35]
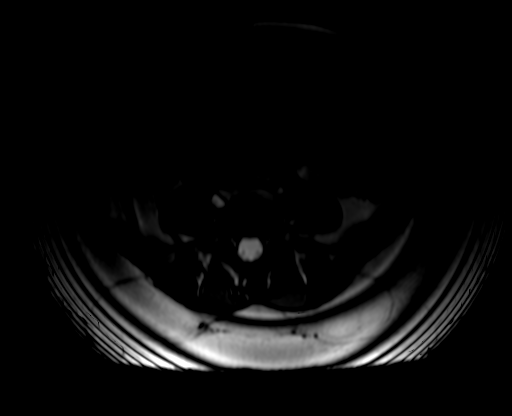
[im 35/35]
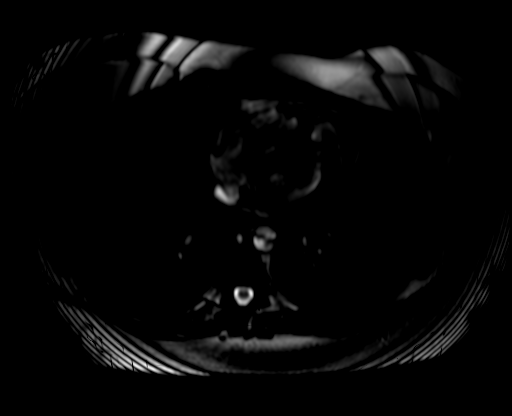

[Series 16: DWI · axial · 6.0mm · 2.00mm/px · z∈[-156,+104]mm · 2 of 37 slices shown]
[im 1/37]
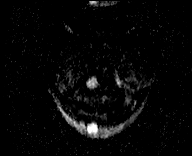
[im 37/37]
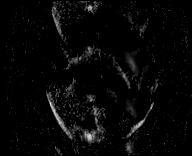

[Series 17: T2 fat-sat · axial · 6.0mm · 1.48mm/px · z∈[-153,+92]mm · 2 of 35 slices shown]
[im 1/35]
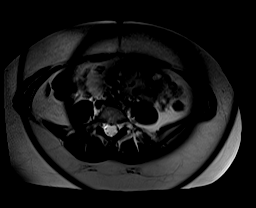
[im 35/35]
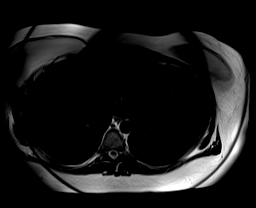

[Series 18: axial dynamic delayed · axial · 3.0mm · 1.19mm/px · z∈[-166,+95]mm · 4 of 88 slices shown (1 of 2)]
[im 1/88]
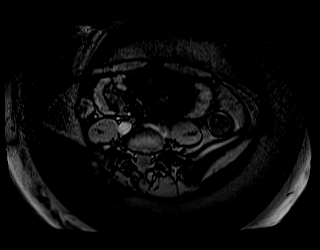
[im 30/88]
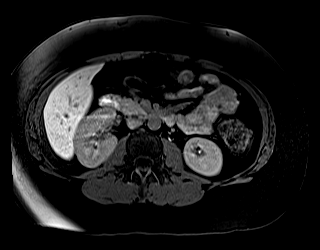
[im 59/88]
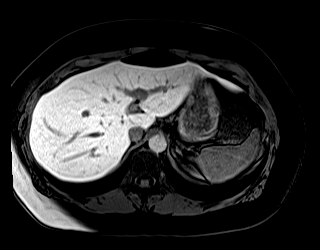
[im 88/88]
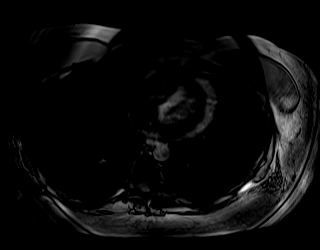

[Series 19: axial dynamic delayed · axial · 3.0mm · 1.19mm/px · z∈[-166,+95]mm · 4 of 88 slices shown (2 of 2)]
[im 1/88]
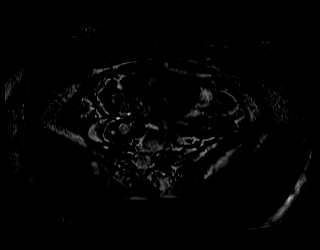
[im 30/88]
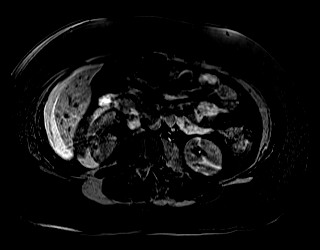
[im 59/88]
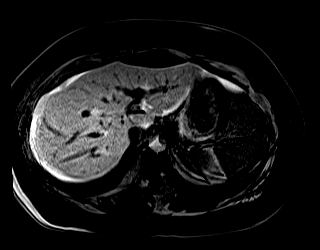
[im 88/88]
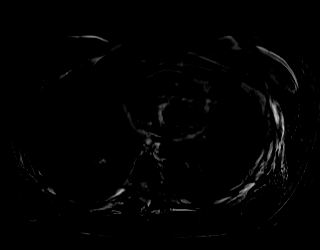

[44 of 48 positions shown; findings below may reference images not displayed]

FINDINGS: Lower chest: No acute findings.

Hepatobiliary: A 1.9 cm hypervascular mass is seen in segment 2
which shows near T2 isointensity, no contrast washout, and delayed
hepatobiliary phase enhancement. This consistent with benign focal
nodular hyperplasia. No other liver masses are identified.
Gallbladder is unremarkable.

Pancreas:  No mass or inflammatory changes.

Spleen:  Within normal limits in size and appearance.

Adrenals/Urinary Tract: No masses identified. No evidence of
hydronephrosis.

Stomach/Bowel: Visualized portion unremarkable.

Vascular/Lymphatic: No pathologically enlarged lymph nodes
identified. No abdominal aortic aneurysm.

Other:  None.

Musculoskeletal:  No suspicious bone lesions identified.
IMPRESSION: 1.9 cm benign focal nodular hyperplasia in the left hepatic lobe,
corresponding with mass seen on recent CT. No other significant
abnormality.

## 2020-12-18 ENCOUNTER — Ambulatory Visit (INDEPENDENT_AMBULATORY_CARE_PROVIDER_SITE_OTHER): Payer: BC Managed Care – PPO | Admitting: Certified Nurse Midwife

## 2020-12-18 ENCOUNTER — Other Ambulatory Visit (HOSPITAL_COMMUNITY)
Admission: RE | Admit: 2020-12-18 | Discharge: 2020-12-18 | Disposition: A | Discharge status: Home / Self Care | Payer: BC Managed Care – PPO | Source: Ambulatory Visit | Attending: Certified Nurse Midwife | Admitting: Certified Nurse Midwife

## 2020-12-18 ENCOUNTER — Other Ambulatory Visit: Payer: Self-pay

## 2020-12-18 VITALS — BP 118/82 | HR 75 | Resp 16 | Ht 69.0 in | Wt 223.9 lb

## 2020-12-18 DIAGNOSIS — N939 Abnormal uterine and vaginal bleeding, unspecified: Secondary | ICD-10-CM | POA: Diagnosis not present

## 2020-12-18 DIAGNOSIS — Z113 Encounter for screening for infections with a predominantly sexual mode of transmission: Secondary | ICD-10-CM | POA: Diagnosis present

## 2020-12-18 NOTE — Progress Notes (Signed)
GYN ENCOUNTER NOTE  Subjective:       Rachael Bennett is a 35 y.o. G0P0000 female is here for gynecologic evaluation of the following issues:  1. Abnormal uterine bleeding. Pt state she has been having bleeding in between her periods for the past few months. She notes this last for a feww days .IT does causes cramping pain that is very uncomfortable. She uses Midol that does not help.     Gynecologic History Patient's last menstrual period was 11/18/2020 (exact date). Contraception: none Last Pap: 09/02/2016. Results were: normal/ HPV neg Last mammogram: n/a .   Obstetric History OB History  Gravida Para Term Preterm AB Living  0 0 0 0 0 0  SAB IAB Ectopic Multiple Live Births  0 0 0 0      Past Medical History:  Diagnosis Date   Acid reflux    Allergy    Bipolar disorder (HCC)    Dysmenorrhea    Headache    Hip pain    Knee pain    Migraine    Seasonal allergies     Past Surgical History:  Procedure Laterality Date   BREAST RECONSTRUCTION     x 3   BREAST RECONSTRUCTION Left    PLACEMENT OF BREAST IMPLANTS     TONSILLECTOMY      Current Outpatient Medications on File Prior to Visit  Medication Sig Dispense Refill   LORazepam (ATIVAN) 1 MG tablet TAKE 1/2 TO 1 TABLET BY MOUTH EVERY DAY AS NEEDED FOR SEVERE ANXIETY     Multiple Vitamin (MULTI-VITAMIN) tablet Take 1 tablet by mouth daily.     propranolol (INDERAL) 40 MG tablet Take by mouth.     traZODone (DESYREL) 50 MG tablet      aspirin-acetaminophen-caffeine (EXCEDRIN MIGRAINE) 250-250-65 MG tablet Take by mouth.     Cetirizine HCl 10 MG CAPS Take by mouth.     cyclobenzaprine (FLEXERIL) 10 MG tablet TK 1 TO 2 TS PO QHS  3   fluticasone (FLONASE) 50 MCG/ACT nasal spray Place 2 sprays into both nostrils daily. 16 g 11   lactase (LACTAID) 3000 units tablet Take by mouth.     LATUDA 60 MG TABS TK 1 T PO Q EVENING WITH MEAL     loratadine (CLARITIN) 10 MG tablet Take 1 tablet (10 mg total) by mouth daily.  (Patient not taking: Reported on 01/12/2019) 30 tablet 0   Melatonin 5 MG TABS Take 5 mg by mouth.     propranolol (INDERAL) 80 MG tablet   11   SODIUM FLUORIDE 5000 PPM 1.1 % PSTE Take by mouth at bedtime.     SUMAtriptan (IMITREX) 25 MG tablet   11   Vitamins/Minerals TABS Take 1 tablet by mouth daily.     No current facility-administered medications on file prior to visit.    No Known Allergies  Social History   Socioeconomic History   Marital status: Married    Spouse name: Not on file   Number of children: Not on file   Years of education: Not on file   Highest education level: Not on file  Occupational History   Not on file  Tobacco Use   Smoking status: Never   Smokeless tobacco: Never  Vaping Use   Vaping Use: Never used  Substance and Sexual Activity   Alcohol use: Not Currently    Comment: occas   Drug use: No   Sexual activity: Yes    Birth control/protection: None  Other  Topics Concern   Not on file  Social History Narrative   Not on file   Social Determinants of Health   Financial Resource Strain: Not on file  Food Insecurity: Not on file  Transportation Needs: Not on file  Physical Activity: Not on file  Stress: Not on file  Social Connections: Not on file  Intimate Partner Violence: Not on file    Family History  Problem Relation Age of Onset   Diabetes Father    Hypertension Father    Hyperlipidemia Father    Heart disease Father    Heart disease Mother        pacemaker (p-28) 04/04/2016   Cancer Maternal Grandfather        lung    The following portions of the patient's history were reviewed and updated as appropriate: allergies, current medications, past family history, past medical history, past social history, past surgical history and problem list.  Review of Systems Review of Systems - Negative except as mentioned in HPI Review of Systems - General ROS: negative for - chills, fatigue, fever, hot flashes, malaise or night  sweats Hematological and Lymphatic ROS: negative for - bleeding problems or swollen lymph nodes Gastrointestinal ROS: negative for - abdominal pain, blood in stools, change in bowel habits and nausea/vomiting Musculoskeletal ROS: negative for - joint pain, muscle pain or muscular weakness Genito-Urinary ROS: negative for - change in menstrual cycle, dysmenorrhea, dyspareunia, dysuria, genital discharge, genital ulcers, hematuria, incontinence, irregular/heavy menses, nocturia or pelvic pain. Positive for abnormal uterine bleeding with cramps.   Objective:   BP 118/82   Pulse 75   Resp 16   Ht 5\' 9"  (1.753 m)   Wt 223 lb 14.4 oz (101.6 kg)   LMP 11/18/2020 (Exact Date)   BMI 33.06 kg/m  CONSTITUTIONAL: Well-developed, well-nourished female in no acute distress.  HENT:  Normocephalic, atraumatic.  NECK: Normal range of motion, supple, no masses.  Normal thyroid.  SKIN: Skin is warm and dry. No rash noted. Not diaphoretic. No erythema. No pallor. NEUROLGIC: Alert and oriented to person, place, and time. PSYCHIATRIC: Normal mood and affect. Normal behavior. Normal judgment and thought content. CARDIOVASCULAR:Not Examined RESPIRATORY: Not Examined BREASTS: Not Examined ABDOMEN: Soft, non distended; Non tender.  No Organomegaly. PELVIC:  External Genitalia: Normal  BUS: Normal  Vagina: Normal  Cervix: Normal, no polyps seen, white discharge  Uterus: Normal size, shape,consistency, mobile , slight enlarged  Adnexa: Normal  RV: Normal   Bladder: Nontender MUSCULOSKELETAL: Normal range of motion. No tenderness.  No cyanosis, clubbing, or edema.     Assessment:    Abnormal uterine bleeding   Plan:   Discussed possible causes including infection, polyp, fibroids. Discussed testing for infections. She agrees Labs and swab for std collected. U/s ordered to evaluate for fibroids or other potential causes. She verbalizes and agees. Discussed use of birth control to manage symptoms .  She declines at this time.Will follow up with lab/us results. Return prn or for annual exam.   11/20/2020, CNM

## 2020-12-19 LAB — HIV ANTIBODY (ROUTINE TESTING W REFLEX): HIV Screen 4th Generation wRfx: NONREACTIVE

## 2020-12-19 LAB — HSV(HERPES SIMPLEX VRS) I + II AB-IGG
HSV 1 Glycoprotein G Ab, IgG: 59.7 index — ABNORMAL HIGH (ref 0.00–0.90)
HSV 2 IgG, Type Spec: <0.91 index (ref 0.00–0.90)

## 2020-12-19 LAB — HEPATITIS C ANTIBODY: Hep C Virus Ab: <0.1 s/co ratio (ref 0.0–0.9)

## 2020-12-19 LAB — HEPATITIS B SURFACE ANTIGEN: Hepatitis B Surface Ag: NEGATIVE

## 2020-12-20 LAB — CERVICOVAGINAL ANCILLARY ONLY
Bacterial Vaginitis (gardnerella): NEGATIVE
Candida Glabrata: NEGATIVE
Candida Vaginitis: NEGATIVE
Chlamydia: NEGATIVE
Comment: NEGATIVE
Comment: NEGATIVE
Comment: NEGATIVE
Comment: NEGATIVE
Comment: NEGATIVE
Comment: NORMAL
Neisseria Gonorrhea: NEGATIVE
Trichomonas: NEGATIVE

## 2021-01-09 ENCOUNTER — Other Ambulatory Visit: Payer: Self-pay

## 2021-01-09 ENCOUNTER — Ambulatory Visit
Admission: RE | Admit: 2021-01-09 | Discharge: 2021-01-09 | Disposition: A | Discharge status: Home / Self Care | Payer: BC Managed Care – PPO | Source: Ambulatory Visit | Attending: Certified Nurse Midwife | Admitting: Certified Nurse Midwife

## 2021-01-09 DIAGNOSIS — N939 Abnormal uterine and vaginal bleeding, unspecified: Secondary | ICD-10-CM | POA: Insufficient documentation

## 2021-05-14 ENCOUNTER — Telehealth: Payer: Self-pay | Admitting: Certified Nurse Midwife

## 2021-05-14 NOTE — Telephone Encounter (Signed)
Pt is calling in stating that she has a cyst that is in her bikini line and would like to know what she should do.

## 2021-05-15 ENCOUNTER — Telehealth: Payer: Self-pay | Admitting: Certified Nurse Midwife

## 2021-05-15 NOTE — Telephone Encounter (Signed)
Pt returned call - she declined scheduling an apt as she said the cyst was resolving - states if she gets another she will call and schedule an appointment.

## 2021-05-15 NOTE — Telephone Encounter (Signed)
Callback number listed did not go through. Pt needs to schedule an appt for further evaluation.

## 2022-01-08 ENCOUNTER — Encounter: Payer: Self-pay | Admitting: Certified Nurse Midwife

## 2022-05-21 ENCOUNTER — Ambulatory Visit: Payer: Self-pay | Admitting: Obstetrics and Gynecology

## 2022-05-25 ENCOUNTER — Ambulatory Visit: Payer: Managed Care, Other (non HMO) | Admitting: Advanced Practice Midwife
